# Patient Record
Sex: Female | Born: 1955 | Race: White | Hispanic: No | Marital: Married | Smoking: Never smoker
Health system: Southern US, Community
[De-identification: ages and names within clinical notes are randomized; demographics above are authoritative.]

## PROBLEM LIST (undated history)

## (undated) DIAGNOSIS — L570 Actinic keratosis: Secondary | ICD-10-CM

## (undated) DIAGNOSIS — K8000 Calculus of gallbladder with acute cholecystitis without obstruction: Secondary | ICD-10-CM

## (undated) DIAGNOSIS — J45909 Unspecified asthma, uncomplicated: Secondary | ICD-10-CM

## (undated) DIAGNOSIS — E785 Hyperlipidemia, unspecified: Secondary | ICD-10-CM

## (undated) DIAGNOSIS — Z8709 Personal history of other diseases of the respiratory system: Secondary | ICD-10-CM

## (undated) DIAGNOSIS — M5126 Other intervertebral disc displacement, lumbar region: Secondary | ICD-10-CM

## (undated) DIAGNOSIS — F419 Anxiety disorder, unspecified: Secondary | ICD-10-CM

## (undated) DIAGNOSIS — C801 Malignant (primary) neoplasm, unspecified: Secondary | ICD-10-CM

## (undated) HISTORY — PX: TONSILLECTOMY: SUR1361

## (undated) HISTORY — DX: Hyperlipidemia, unspecified: E78.5

## (undated) HISTORY — DX: Calculus of gallbladder with acute cholecystitis without obstruction: K80.00

## (undated) HISTORY — DX: Other intervertebral disc displacement, lumbar region: M51.26

## (undated) HISTORY — DX: Malignant (primary) neoplasm, unspecified: C80.1

## (undated) HISTORY — DX: Actinic keratosis: L57.0

## (undated) HISTORY — PX: OTHER SURGICAL HISTORY: SHX169

## (undated) HISTORY — DX: Anxiety disorder, unspecified: F41.9

## (undated) HISTORY — DX: Unspecified asthma, uncomplicated: J45.909

---

## 2008-09-27 ENCOUNTER — Other Ambulatory Visit: Admission: RE | Admit: 2008-09-27 | Discharge: 2008-09-27 | Payer: Self-pay | Admitting: Family Medicine

## 2008-10-04 ENCOUNTER — Encounter: Admission: RE | Admit: 2008-10-04 | Discharge: 2008-10-04 | Payer: Self-pay | Admitting: Family Medicine

## 2012-04-21 DIAGNOSIS — E039 Hypothyroidism, unspecified: Secondary | ICD-10-CM | POA: Insufficient documentation

## 2015-02-16 ENCOUNTER — Encounter: Payer: Self-pay | Admitting: Obstetrics & Gynecology

## 2015-02-16 ENCOUNTER — Ambulatory Visit (INDEPENDENT_AMBULATORY_CARE_PROVIDER_SITE_OTHER): Payer: Managed Care, Other (non HMO) | Admitting: Obstetrics & Gynecology

## 2015-02-16 VITALS — BP 126/72 | HR 105 | Resp 16 | Ht 67.5 in | Wt 155.0 lb

## 2015-02-16 DIAGNOSIS — Z1151 Encounter for screening for human papillomavirus (HPV): Secondary | ICD-10-CM

## 2015-02-16 DIAGNOSIS — Z01411 Encounter for gynecological examination (general) (routine) with abnormal findings: Secondary | ICD-10-CM

## 2015-02-16 DIAGNOSIS — Z124 Encounter for screening for malignant neoplasm of cervix: Secondary | ICD-10-CM

## 2015-02-16 DIAGNOSIS — Z0000 Encounter for general adult medical examination without abnormal findings: Secondary | ICD-10-CM

## 2015-02-16 DIAGNOSIS — L989 Disorder of the skin and subcutaneous tissue, unspecified: Secondary | ICD-10-CM

## 2015-02-16 DIAGNOSIS — N9089 Other specified noninflammatory disorders of vulva and perineum: Secondary | ICD-10-CM

## 2015-02-16 DIAGNOSIS — R1904 Left lower quadrant abdominal swelling, mass and lump: Secondary | ICD-10-CM

## 2015-02-16 DIAGNOSIS — R1900 Intra-abdominal and pelvic swelling, mass and lump, unspecified site: Secondary | ICD-10-CM

## 2015-02-16 NOTE — Progress Notes (Signed)
Subjective:    Taylor Dodson is a 59 y.o. MW G19 female who presents for an annual exam. The patient has no complaints today. The patient is sexually active. GYN screening history: last pap: was normal. The patient wears seatbelts: yes. The patient participates in regular exercise: yes. (goes to the gym)  Has the patient ever been transfused or tattooed?: no. The patient reports that there is not domestic violence in her life.   Menstrual History: OB History    Gravida Para Term Preterm AB TAB SAB Ectopic Multiple Living   0 0 0 0 0 0 0 0 0 0       Menarche age: 70  No LMP recorded. Patient is postmenopausal.    The following portions of the patient's history were reviewed and updated as appropriate: allergies, current medications, past family history, past medical history, past social history, past surgical history and problem list.  Review of Systems A comprehensive review of systems was negative. 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 She has had a flu vaccine. Her colonoscopy was done at 59 yo. Married for 25 years. Denies dyspareunia.   Objective:    BP 126/72 mmHg  Pulse 105  Resp 16  Ht 5' 7.5" (1.715 m)  Wt 155 lb (70.308 kg)  BMI 23.90 kg/m2  General Appearance:    Alert, cooperative, no distress, appears stated age  Head:    Normocephalic, without obvious abnormality, atraumatic  Eyes:    PERRL, conjunctiva/corneas clear, EOM's intact, fundi    benign, both eyes  Ears:    Normal TM's and external ear canals, both ears  Nose:   Nares normal, septum midline, mucosa normal, no drainage    or sinus tenderness  Throat:   Lips, mucosa, and tongue normal; teeth and gums normal  Neck:   Supple, symmetrical, trachea midline, no adenopathy;    thyroid:  no enlargement/tenderness/nodules; no carotid   bruit or JVD  Back:     Symmetric, no curvature, ROM normal, no CVA tenderness  Lungs:     Clear to auscultation bilaterally, respirations unlabored  Chest Wall:    No  tenderness or deformity   Heart:    Regular rate and rhythm, S1 and S2 normal, no murmur, rub   or gallop  Breast Exam:    No tenderness, masses, or nipple abnormality  Abdomen:     Soft, non-tender, bowel sounds active all four quadrants,    no masses, no organomegaly  Genitalia:    Normal female without lesion, discharge or tenderness, marked atrophy, right labia minora with a white lesion, NSSA, NT, mobile, left adnexal mass appreciated     Extremities:   Extremities normal, atraumatic, no cyanosis or edema  Pulses:   2+ and symmetric all extremities  Skin:   Skin color, texture, turgor normal, no rashes or lesions  Lymph nodes:   Cervical, supraclavicular, and axillary nodes normal  Neurologic:   CNII-XII intact, normal strength, sensation and reflexes    throughout  .    Assessment:    Healthy female exam.   New onset panic attacks - for the last year Right labial lesion Left pelvic mass   Plan:     Breast self exam technique reviewed and patient encouraged to perform self-exam monthly. Mammogram. Thin prep Pap smear.   Referral to Dr. Madilyn Fireman for panic attacks Schedule gyn u/s RTC for vulvar biopsy (after u/s)

## 2015-02-17 LAB — CYTOLOGY - PAP

## 2015-02-18 ENCOUNTER — Ambulatory Visit (INDEPENDENT_AMBULATORY_CARE_PROVIDER_SITE_OTHER): Payer: Managed Care, Other (non HMO) | Admitting: Family Medicine

## 2015-02-18 ENCOUNTER — Encounter: Payer: Self-pay | Admitting: Family Medicine

## 2015-02-18 DIAGNOSIS — E785 Hyperlipidemia, unspecified: Secondary | ICD-10-CM | POA: Insufficient documentation

## 2015-02-18 DIAGNOSIS — B001 Herpesviral vesicular dermatitis: Secondary | ICD-10-CM

## 2015-02-18 DIAGNOSIS — F410 Panic disorder [episodic paroxysmal anxiety] without agoraphobia: Secondary | ICD-10-CM

## 2015-02-18 MED ORDER — ACYCLOVIR 400 MG PO TABS: 400.0000 mg | ORAL_TABLET | Freq: Every day | ORAL | Status: DC

## 2015-02-18 NOTE — Progress Notes (Signed)
CC: Taylor Dodson is a 59 y.o. female is here for Establish Care   Subjective: HPI:  Pleasant 59 year old here to establish care  Patient complains of what she describes as panic attacks that have been present ever since July of this past summer. Symptoms can occur anytime of the day. They're reproducible if she is faced with the challenge of having to cross the bridge. She is fearful of bridges to the point where she will stop her car and get out if she is approaching the bridge. She denies any recent or remote event that seemed to cause this new sensation. She also notices at work she will have episodes where she loses her train of thought and then immediately starts panicking that something is wrong such as a heart attack however she does not have any chest pain or any other physical symptoms. She's also knows that she is much more nervous about nothing in particular on a daily basis. She's been awaking at night around 2 AM fully awake when she can usually sleep throughout the entire night without any difficulty. No interventions as of yet other than exercising more frequently that has not helped her worsen the above symptoms. Symptoms are mild to moderate in severity and have been persistent ever since onset in the summer.  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 It's been well controlled with acyclovir in the past. She was told she had to stop taking this when she established with her former PCP due to potential side effects. Since then she's had worsening outbreaks on the upper and lower lip without any benefit from lysine.  Review of Systems - General ROS: negative for - chills, fever, night sweats, weight gain or weight loss Ophthalmic ROS: negative for - decreased vision Psychological ROS: negative for -depression ENT ROS: negative for - hearing change, nasal congestion, tinnitus or allergies Hematological and  Lymphatic ROS: negative for - bleeding problems, bruising or swollen lymph nodes Breast ROS: negative Respiratory ROS: no cough, shortness of breath, or wheezing Cardiovascular ROS: no chest pain or dyspnea on exertion Gastrointestinal ROS: no abdominal pain, change in bowel habits, or black or bloody stools Genito-Urinary ROS: negative for - genital discharge, genital ulcers, incontinence or abnormal bleeding from genitals Musculoskeletal ROS: negative for - joint pain or muscle pain Neurological ROS: negative for - headaches or memory loss Dermatological ROS: negative for lumps, mole changes, rash and skin lesion changes other than that described above   Past Medical History  Diagnosis Date  . Asthma   . Hyperlipidemia     Past Surgical History  Procedure Laterality Date  . Tonsillectomy    . Neck tumor      benign   Family History  Problem Relation Age of Onset  . Cancer Mother     pancreatic  . Stroke Father   . Hypertension Father   . Mitral valve prolapse Father     History   Social History  . Marital Status: Married    Spouse Name: N/A  . Number of Children: N/A  . Years of Education: N/A   Occupational History  . progran analyst    Social History Main Topics  . Smoking status: Never Smoker   . Smokeless tobacco: Never Used  . Alcohol Use: 0.0 oz/week    0 Standard drinks or equivalent per week     Comment: 1 wine weekly  . Drug Use:  No  . Sexual Activity:    Partners: Male   Other Topics Concern  . Not on file   Social History Narrative     Objective: BP 136/73 mmHg  Pulse 94  Ht 5\' 7"  (1.702 m)  Wt 155 lb (70.308 kg)  BMI 24.27 kg/m2  Vital signs reviewed. General: Alert and Oriented, No Acute Distress HEENT: Pupils equal, round, reactive to light. Conjunctivae clear.  External ears unremarkable.  Moist mucous membranes. Lungs: Clear and comfortable work of breathing, speaking in full sentences without accessory muscle use. Cardiac: Regular  rate and rhythm.  Neuro: CN II-XII grossly intact, gait normal. Extremities: No peripheral edema.  Strong peripheral pulses.  Mental Status: No depression, anxiety, nor agitation. Logical though process. Skin: Warm and dry. no lesions on the lip   Assessment & Plan: Anselma was seen today for establish care.  Diagnoses and all orders for this visit:  Panic attacks Orders: -     TSH -     T3 -     T4 -     COMPLETE METABOLIC PANEL WITH GFR  Recurrent cold sores  Other orders -     acyclovir (ZOVIRAX) 400 MG tablet; Take 1 tablet (400 mg total) by mouth daily.   panic attacks: Ruling out hyperthyroidism or metabolic abnormality above.  She will begin thinking about taking a daily antianxiety medication while labs are pending, she is not interested in going to see a counselor or psychologist for cognitive behavior therapy because it might raise red flags with her employer.   recurrent cold sores: Prescription for acyclovir provided, I looked back at her labs in the [facility] system stemming back until 2011 and see no reason why she needed to stop this medication.  Return if symptoms worsen or fail to improve.

## 2015-02-19 LAB — COMPLETE METABOLIC PANEL WITH GFR
ALBUMIN: 4.7 g/dL (ref 3.5–5.2)
ALK PHOS: 83 U/L (ref 39–117)
ALT: 20 U/L (ref 0–35)
AST: 20 U/L (ref 0–37)
BUN: 14 mg/dL (ref 6–23)
CHLORIDE: 103 meq/L (ref 96–112)
CO2: 28 meq/L (ref 19–32)
Calcium: 9.9 mg/dL (ref 8.4–10.5)
Creat: 0.7 mg/dL (ref 0.50–1.10)
GFR, Est African American: >89 mL/min
GLUCOSE: 102 mg/dL — AB (ref 70–99)
Potassium: 4.7 mEq/L (ref 3.5–5.3)
Sodium: 143 mEq/L (ref 135–145)
Total Bilirubin: 0.3 mg/dL (ref 0.2–1.2)
Total Protein: 7.2 g/dL (ref 6.0–8.3)

## 2015-02-19 LAB — TSH: TSH: 1.708 u[IU]/mL (ref 0.350–4.500)

## 2015-02-19 LAB — T4: T4, Total: 7.8 ug/dL (ref 4.5–12.0)

## 2015-02-19 LAB — T3: T3, Total: 114 ng/dL (ref 80.0–204.0)

## 2015-02-23 ENCOUNTER — Other Ambulatory Visit: Payer: Managed Care, Other (non HMO)

## 2015-02-23 ENCOUNTER — Ambulatory Visit: Payer: Managed Care, Other (non HMO)

## 2015-02-25 ENCOUNTER — Telehealth: Payer: Self-pay | Admitting: *Deleted

## 2015-02-25 MED ORDER — ESCITALOPRAM OXALATE 10 MG PO TABS: 10.0000 mg | ORAL_TABLET | Freq: Every day | ORAL | Status: DC

## 2015-02-25 NOTE — Telephone Encounter (Signed)
Pt would like the the lexapro called into her pharm for her panic attacks( see result note)

## 2015-02-25 NOTE — Telephone Encounter (Signed)
Pt.notified

## 2015-02-25 NOTE — Telephone Encounter (Signed)
Taylor Dodson, Rx has been sent, will you please advise patient to take half a tab daily for the first five days then a full tablet daily thereafter.  F/u one month to assess response.

## 2015-03-02 ENCOUNTER — Ambulatory Visit (INDEPENDENT_AMBULATORY_CARE_PROVIDER_SITE_OTHER): Payer: Managed Care, Other (non HMO)

## 2015-03-02 DIAGNOSIS — Z0000 Encounter for general adult medical examination without abnormal findings: Secondary | ICD-10-CM

## 2015-03-02 DIAGNOSIS — R1900 Intra-abdominal and pelvic swelling, mass and lump, unspecified site: Secondary | ICD-10-CM

## 2015-03-02 DIAGNOSIS — Z1231 Encounter for screening mammogram for malignant neoplasm of breast: Secondary | ICD-10-CM

## 2015-03-15 ENCOUNTER — Telehealth: Payer: Self-pay | Admitting: *Deleted

## 2015-03-15 MED ORDER — VENLAFAXINE HCL ER 75 MG PO CP24: 75.0000 mg | ORAL_CAPSULE | Freq: Every day | ORAL | Status: DC

## 2015-03-15 NOTE — Telephone Encounter (Signed)
Pt called and states the lexapro rx' caused red blotching on her neck which then spread to her face and then she broke out in a rash. Pt stopped taking the medication. Lexapro has been added to allergy list

## 2015-03-15 NOTE — Telephone Encounter (Signed)
Pt said she would try it

## 2015-03-15 NOTE — Telephone Encounter (Signed)
If she's still interested in taking something to help with reducing or hopefully eliminating her panic attacks I'll send over a Rx of generic effexor [facility]. It works similarly to [employer] but molecularly is different in hopes that it doesn't provoke another allergic reaction.

## 2015-03-23 ENCOUNTER — Telehealth: Payer: Self-pay | Admitting: *Deleted

## 2015-03-23 NOTE — Telephone Encounter (Signed)
Taylor Dodson/coverage, Will you please let patient know that it has been charted that she never started the Effexor.  She may want to let her pharmacy know that the Rx I have on file for the Effexor was fur a whole capsule, her 1/2 instructions may have been an error with the pharmacy.

## 2015-03-28 NOTE — Telephone Encounter (Signed)
Message left on pt's vm

## 2015-03-29 ENCOUNTER — Telehealth: Payer: Self-pay | Admitting: *Deleted

## 2015-03-29 DIAGNOSIS — Z79899 Other long term (current) drug therapy: Secondary | ICD-10-CM

## 2015-03-29 DIAGNOSIS — Z5181 Encounter for therapeutic drug level monitoring: Secondary | ICD-10-CM

## 2015-03-29 DIAGNOSIS — E785 Hyperlipidemia, unspecified: Secondary | ICD-10-CM

## 2015-03-29 MED ORDER — PRAVASTATIN SODIUM 40 MG PO TABS: 40.0000 mg | ORAL_TABLET | Freq: Every day | ORAL | Status: DC

## 2015-03-29 NOTE — Telephone Encounter (Signed)
Seth Bake, [facility], needs bloodwork before future refills, lab slips in your inbox.  Fasting within the next 3 weeks.

## 2015-03-29 NOTE — Telephone Encounter (Signed)
Pt is requesting refill on pravachol

## 2015-03-30 NOTE — Telephone Encounter (Signed)
Pt states her last chol check was 12/15 and was 195 FYI

## 2015-03-31 NOTE — Telephone Encounter (Signed)
Spoke with patient..Taylor Dodson

## 2015-04-22 LAB — HEPATIC FUNCTION PANEL
ALBUMIN: 4.6 g/dL (ref 3.5–5.2)
ALT: 21 U/L (ref 0–35)
AST: 21 U/L (ref 0–37)
Alkaline Phosphatase: 96 U/L (ref 39–117)
BILIRUBIN DIRECT: 0.1 mg/dL (ref 0.0–0.3)
BILIRUBIN INDIRECT: 0.3 mg/dL (ref 0.2–1.2)
BILIRUBIN TOTAL: 0.4 mg/dL (ref 0.2–1.2)
Total Protein: 7.2 g/dL (ref 6.0–8.3)

## 2015-04-22 LAB — LIPID PANEL
CHOL/HDL RATIO: 2.9 ratio
CHOLESTEROL: 206 mg/dL — AB (ref 0–200)
HDL: 71 mg/dL (ref >=46)
LDL Cholesterol: 117 mg/dL — ABNORMAL HIGH (ref 0–99)
Triglycerides: 92 mg/dL (ref <150)
VLDL: 18 mg/dL (ref 0–40)

## 2015-04-25 ENCOUNTER — Other Ambulatory Visit: Payer: Self-pay | Admitting: Family Medicine

## 2015-04-25 ENCOUNTER — Telehealth: Payer: Self-pay | Admitting: Family Medicine

## 2015-04-25 MED ORDER — PRAVASTATIN SODIUM 40 MG PO TABS: 60.0000 mg | ORAL_TABLET | Freq: Every day | ORAL | Status: DC

## 2015-04-25 NOTE — Telephone Encounter (Signed)
Taylor Dodson, Will you please let patient know that her LDL cholesterol was mildly elevated and I'd recommend she increase her pravastatin dose to 60mg  daily.  I've sent a new Rx [facility]

## 2015-04-25 NOTE — Telephone Encounter (Signed)
Pt.notified

## 2015-09-25 ENCOUNTER — Emergency Department (INDEPENDENT_AMBULATORY_CARE_PROVIDER_SITE_OTHER)
Admission: EM | Admit: 2015-09-25 | Discharge: 2015-09-25 | Disposition: A | Discharge status: Home / Self Care | Payer: Managed Care, Other (non HMO) | Source: Home / Self Care | Attending: Family Medicine | Admitting: Family Medicine

## 2015-09-25 DIAGNOSIS — M5432 Sciatica, left side: Secondary | ICD-10-CM

## 2015-09-25 MED ORDER — TRAMADOL HCL 50 MG PO TABS: 50.0000 mg | ORAL_TABLET | Freq: Four times a day (QID) | ORAL | Status: DC | PRN

## 2015-09-25 MED ORDER — PREDNISONE 20 MG PO TABS: ORAL_TABLET | ORAL | Status: DC

## 2015-09-25 MED ORDER — IBUPROFEN 600 MG PO TABS: 600.0000 mg | ORAL_TABLET | Freq: Four times a day (QID) | ORAL | Status: DC | PRN

## 2015-09-25 NOTE — Discharge Instructions (Signed)
°  Tramadol is strong pain medication. While taking, do not drink alcohol, drive, or perform any other activities that requires focus while taking these medications.  ° °

## 2015-09-25 NOTE — ED Provider Notes (Signed)
CSN: [id number]     Arrival date & time 09/25/15  1238 History   First MD Initiated Contact with Patient 09/25/15 1245     Chief Complaint  Patient presents with  . Sciatica    Left   (Consider location/radiation/quality/duration/timing/severity/associated sxs/prior Treatment) HPI  Pt is a 59yo female presenting to [facility] with c/o sudden onset Left sided sciatic pain that woke pt from her sleep last night. Pain is moderate to severe, worse with sitting and laying in certain positions.  Pain is aching, burning and sharp, radiating down left leg into her toes.  Pt reports hx of similar pain about 1 year ago but on her Right side and pain at that time stopped at her knee. Pt was evaluated by a surgeon at that time as she had a herniated disc.  She never had surgery but was sent to physical therapy.  Symptoms resolved until started suddenly last night. Denies known heavy lifting or falls.  Denies fever, chills, or rashes. No other pain.  Past Medical History  Diagnosis Date  . Asthma   . Hyperlipidemia    Past Surgical History  Procedure Laterality Date  . Tonsillectomy    . Neck tumor      benign   Family History  Problem Relation Age of Onset  . Cancer Mother     pancreatic  . Stroke Father   . Hypertension Father   . Mitral valve prolapse Father    Social History  Substance Use Topics  . Smoking status: Never Smoker   . Smokeless tobacco: Never Used  . Alcohol Use: 0.0 oz/week    0 Standard drinks or equivalent per week     Comment: 1 wine weekly   OB History    Gravida Para Term Preterm AB TAB SAB Ectopic Multiple Living   0 0 0 0 0 0 0 0 0 0      Review of Systems  Constitutional: Negative for fever and chills.  Genitourinary: Negative for dysuria, frequency and flank pain.  Musculoskeletal: Positive for myalgias, back pain, arthralgias and gait problem. Negative for joint swelling.       Left buttock and Leg  Skin: Negative for color change and wound.  Neurological:  Positive for numbness. Negative for weakness.    Allergies  Augmentin and Lexapro  Home Medications   Prior to Admission medications   Medication Sig Start Date End Date Taking? Authorizing Provider  acyclovir (ZOVIRAX) 400 MG tablet Take 1 tablet (400 mg total) by mouth daily. 02/18/15  Yes Sean Hommel, DO  aspirin 81 MG tablet Take 81 mg by mouth.   Yes Historical Provider, MD  Ciclesonide 37 MCG/ACT AERS Place 1 puff into the nose. 10/24/11  Yes Historical Provider, MD  co-enzyme Q-10 30 MG capsule Take 30 mg by mouth.   Yes Historical Provider, MD  L-Lysine 1000 MG TABS Take 1 tablet by mouth.   Yes Historical Provider, MD  magnesium gluconate (MAGONATE) 500 MG tablet Take 500 mg by mouth.   Yes Historical Provider, MD  Multiple Vitamin (MULTIVITAMIN) tablet Take 1 tablet by mouth.   Yes Historical Provider, MD  pravastatin (PRAVACHOL) 40 MG tablet Take 1.5 tablets (60 mg total) by mouth daily. 04/25/15  Yes Sean Hommel, DO  ibuprofen (ADVIL,MOTRIN) 600 MG tablet Take 1 tablet (600 mg total) by mouth every 6 (six) hours as needed. 09/25/15   Noland Fordyce, PA-C  predniSONE (DELTASONE) 20 MG tablet 3 tabs po day one, then 2 po daily x  4 days 09/25/15   Noland Fordyce, PA-C  traMADol (ULTRAM) 50 MG tablet Take 1 tablet (50 mg total) by mouth every 6 (six) hours as needed. 09/25/15   Noland Fordyce, PA-C   Meds Ordered and Administered this Visit  Medications - No data to display  BP 153/86 mmHg  Pulse 93  Temp(Src) 97.8 F (36.6 C)  Wt 158 lb (71.668 kg)  SpO2 99% No data found.   Physical Exam  Constitutional: She is oriented to person, place, and time. She appears well-developed and well-nourished.  HENT:  Head: Normocephalic and atraumatic.  Eyes: EOM are normal.  Neck: Normal range of motion.  Cardiovascular: Normal rate.   Pulmonary/Chest: Effort normal.  Musculoskeletal: Normal range of motion. She exhibits tenderness. She exhibits no edema.  No midline spinal tenderness.  Full ROM upper and lower extremities bilaterally. Tenderness to Left buttock, Left posterior thigh and calf.   Calf is soft. Full ROM Left hip, knee and ankle. Increased pain with movement of Left leg and foot (plantarflexion and dorsiflexion) against resistance.  Neurological: She is alert and oriented to person, place, and time.  Reflex Scores:      Patellar reflexes are 2+ on the left side. Left leg: normal sensation. Antalgic gait.  Skin: Skin is warm and dry.  Psychiatric: She has a normal mood and affect. Her behavior is normal.  Nursing note and vitals reviewed.   ED Course  Procedures (including critical care time)  Labs Review Labs Reviewed - No data to display  Imaging Review No results found.    MDM   1. Left sided sciatica     Pt c/o Left sided sciatica w/o known cause. No recent injury. Exam and hx c/w Left sided sciatica. Doubt emergent process taking place at this time including cauda equina or spinal abscess. No imaging indicated at this time. Will tx conservatively. Rx: tramadol (pt hesitant on taking 'strong' pain medication as she has not taken it in the past), ibuprofen and prednisone. Home exercises in pt education packet provided. Encouraged to f/u with PCP in 1 week if not improving, may need referral to Sports Medicine or PT for further treatment. Pt declined work note. Patient verbalized understanding and agreement with treatment plan.     Noland Fordyce, PA-C 09/25/15 1324

## 2015-09-25 NOTE — ED Notes (Signed)
Patient complains of sciatic pain on the left side, states pain radiates down left leg , sx started last night

## 2015-11-01 ENCOUNTER — Other Ambulatory Visit: Payer: Self-pay | Admitting: Family Medicine

## 2016-01-27 ENCOUNTER — Other Ambulatory Visit: Payer: Self-pay | Admitting: Family Medicine

## 2016-02-04 ENCOUNTER — Emergency Department (INDEPENDENT_AMBULATORY_CARE_PROVIDER_SITE_OTHER)
Admission: EM | Admit: 2016-02-04 | Discharge: 2016-02-04 | Disposition: A | Discharge status: Home / Self Care | Payer: Managed Care, Other (non HMO) | Source: Home / Self Care | Attending: Family Medicine | Admitting: Family Medicine

## 2016-02-04 ENCOUNTER — Encounter: Payer: Self-pay | Admitting: Emergency Medicine

## 2016-02-04 DIAGNOSIS — J069 Acute upper respiratory infection, unspecified: Secondary | ICD-10-CM | POA: Diagnosis not present

## 2016-02-04 DIAGNOSIS — B9789 Other viral agents as the cause of diseases classified elsewhere: Principal | ICD-10-CM

## 2016-02-04 MED ORDER — AZITHROMYCIN 250 MG PO TABS: ORAL_TABLET | ORAL | Status: DC

## 2016-02-04 MED ORDER — PREDNISONE 20 MG PO TABS: 20.0000 mg | ORAL_TABLET | Freq: Two times a day (BID) | ORAL | Status: DC

## 2016-02-04 NOTE — ED Provider Notes (Signed)
CSN: [id number]     Arrival date & time 02/04/16  1339 History   First MD Initiated Contact with Patient 02/04/16 1556     Chief Complaint  Patient presents with  . Otalgia  . Nasal Congestion      HPI Comments: Patient complains of six day history of typical cold-like symptoms including mild sore throat, sinus congestion, headache, fatigue, and cough.  She has now developed a right earache and facial pain.  She had asthma as a child.  The history is provided by the patient.    Past Medical History  Diagnosis Date  . Asthma   . Hyperlipidemia    Past Surgical History  Procedure Laterality Date  . Tonsillectomy    . Neck tumor      benign   Family History  Problem Relation Age of Onset  . Cancer Mother     pancreatic  . Stroke Father   . Hypertension Father   . Mitral valve prolapse Father    Social History  Substance Use Topics  . Smoking status: Never Smoker   . Smokeless tobacco: Never Used  . Alcohol Use: 0.0 oz/week    0 Standard drinks or equivalent per week     Comment: 1 wine weekly   OB History    Gravida Para Term Preterm AB TAB SAB Ectopic Multiple Living   0 0 0 0 0 0 0 0 0 0      Review of Systems No sore throat + cough No pleuritic pain No wheezing + nasal congestion + post-nasal drainage + sinus pain/pressure No itchy/red eyes + right earache No hemoptysis No SOB No fever, + chills No nausea No vomiting No abdominal pain No diarrhea No urinary symptoms No skin rash + fatigue No myalgias + headache Used OTC meds without relief  Allergies  Augmentin and Lexapro  Home Medications   Prior to Admission medications   Medication Sig Start Date End Date Taking? Authorizing Provider  acyclovir (ZOVIRAX) 400 MG tablet Take 1 tablet (400 mg total) by mouth daily. NEED FOLLOW UP APPOINTMENT FOR MORE REFILLS 01/30/16   Marcial Pacas, DO  aspirin 81 MG tablet Take 81 mg by mouth.    Historical Provider, MD  azithromycin (ZITHROMAX Z-PAK) 250 MG  tablet Take 2 tabs today; then begin one tab once daily for 4 more days. 02/04/16   Kandra Nicolas, MD  Ciclesonide 37 MCG/ACT AERS Place 1 puff into the nose. 10/24/11   Historical Provider, MD  co-enzyme Q-10 30 MG capsule Take 30 mg by mouth.    Historical Provider, MD  ibuprofen (ADVIL,MOTRIN) 600 MG tablet Take 1 tablet (600 mg total) by mouth every 6 (six) hours as needed. 09/25/15   Noland Fordyce, PA-C  L-Lysine 1000 MG TABS Take 1 tablet by mouth.    Historical Provider, MD  magnesium gluconate (MAGONATE) 500 MG tablet Take 500 mg by mouth.    Historical Provider, MD  Multiple Vitamin (MULTIVITAMIN) tablet Take 1 tablet by mouth.    Historical Provider, MD  pravastatin (PRAVACHOL) 40 MG tablet TAKE 1.5 TABLETS (60 MG TOTAL) BY MOUTH DAILY. 11/01/15   Sean Hommel, DO  predniSONE (DELTASONE) 20 MG tablet Take 1 tablet (20 mg total) by mouth 2 (two) times daily. Take with food. 02/04/16   Kandra Nicolas, MD  traMADol (ULTRAM) 50 MG tablet Take 1 tablet (50 mg total) by mouth every 6 (six) hours as needed. 09/25/15   Noland Fordyce, PA-C   Meds Ordered and  Administered this Visit  Medications - No data to display  BP 137/83 mmHg  Pulse 98  Temp(Src) 97.9 F (36.6 C) (Oral)  Resp 16  Ht 5' 7.5" (1.715 m)  Wt 155 lb (70.308 kg)  BMI 23.90 kg/m2  SpO2 100% No data found.   Physical Exam Nursing notes and Vital Signs reviewed. Appearance:  Patient appears stated age, and in no acute distress Eyes:  Pupils are equal, round, and reactive to light and accomodation.  Extraocular movement is intact.  Conjunctivae are not inflamed  Ears:  Canals normal.  Tympanic membranes normal.  Nose:  Mildly congested turbinates.   Maxillary sinus tenderness is present.  Pharynx:  Normal Neck:  Supple.  Tender enlarged posterior nodes are palpated bilaterally  Lungs:  Clear to auscultation.  Breath sounds are equal.  Moving air well. Chest:  Distinct tenderness to palpation over the mid-sternum.  Heart:   Regular rate and rhythm without murmurs, rubs, or gallops.  Abdomen:  Nontender without masses or hepatosplenomegaly.  Bowel sounds are present.  No CVA or flank tenderness.  Extremities:  No edema.  Skin:  No rash present.   ED Course  Procedures none    Labs Reviewed -    Tympanogram:  Normal both ears    MDM   1. Viral URI with cough    Begin Z-pack, and prednisone burst. Take plain guaifenesin (1200mg  extended release tabs such as Mucinex) twice daily, with plenty of water, for cough and congestion.  May add Pseudoephedrine (30mg , one or two every 4 to 6 hours) for sinus congestion.  Get adequate rest.   May use Afrin nasal spray (or generic oxymetazoline) twice daily for about 5 days and then discontinue.  Also recommend using saline nasal spray several times daily and saline nasal irrigation (AYR is a common brand).  Use Flonase nasal spray each morning after using Afrin nasal spray and saline nasal irrigation. Try warm salt water gargles for sore throat.  Stop all antihistamines for now, and other non-prescription cough/cold preparations. May take Delsym Cough Suppressant at bedtime for nighttime cough.  Follow-up with family doctor if not improving about10 days.     Kandra Nicolas, MD 02/08/16 2020

## 2016-02-04 NOTE — Discharge Instructions (Signed)
Take plain guaifenesin (1200mg extended release tabs such as Mucinex) twice daily, with plenty of water, for cough and congestion.  May add Pseudoephedrine (30mg, one or two every 4 to 6 hours) for sinus congestion.  Get adequate rest.   °May use Afrin nasal spray (or generic oxymetazoline) twice daily for about 5 days and then discontinue.  Also recommend using saline nasal spray several times daily and saline nasal irrigation (AYR is a common brand).  Use Flonase nasal spray each morning after using Afrin nasal spray and saline nasal irrigation. °Try warm salt water gargles for sore throat.  °Stop all antihistamines for now, and other non-prescription cough/cold preparations. °May take Delsym Cough Suppressant at bedtime for nighttime cough.  °Follow-up with family doctor if not improving about10 days.  °

## 2016-02-04 NOTE — ED Notes (Signed)
Patient reports ear pain, headaches and congestion; feels as though she has sinus infection; tylenol at noon.

## 2016-02-10 ENCOUNTER — Telehealth: Payer: Self-pay

## 2016-02-20 ENCOUNTER — Encounter: Payer: Self-pay | Admitting: Family Medicine

## 2016-02-20 ENCOUNTER — Ambulatory Visit (INDEPENDENT_AMBULATORY_CARE_PROVIDER_SITE_OTHER): Payer: Managed Care, Other (non HMO)

## 2016-02-20 ENCOUNTER — Ambulatory Visit (INDEPENDENT_AMBULATORY_CARE_PROVIDER_SITE_OTHER): Payer: Managed Care, Other (non HMO) | Admitting: Family Medicine

## 2016-02-20 VITALS — BP 115/74 | HR 116 | Wt 150.0 lb

## 2016-02-20 DIAGNOSIS — R079 Chest pain, unspecified: Secondary | ICD-10-CM

## 2016-02-20 DIAGNOSIS — R112 Nausea with vomiting, unspecified: Secondary | ICD-10-CM

## 2016-02-20 LAB — CBC WITH DIFFERENTIAL/PLATELET
Basophils Absolute: 0.1 10*3/uL (ref 0.0–0.1)
Basophils Relative: 1 % (ref 0–1)
EOS ABS: 0.1 10*3/uL (ref 0.0–0.7)
EOS PCT: 1 % (ref 0–5)
HCT: 47.2 % — ABNORMAL HIGH (ref 36.0–46.0)
HEMOGLOBIN: 15.5 g/dL — AB (ref 12.0–15.0)
LYMPHS PCT: 20 % (ref 12–46)
Lymphs Abs: 2.9 10*3/uL (ref 0.7–4.0)
MCH: 31.6 pg (ref 26.0–34.0)
MCHC: 32.8 g/dL (ref 30.0–36.0)
MCV: 96.1 fL (ref 78.0–100.0)
MONO ABS: 1.8 10*3/uL — AB (ref 0.1–1.0)
MPV: 9.3 fL (ref 8.6–12.4)
Monocytes Relative: 12 % (ref 3–12)
Neutro Abs: 9.6 10*3/uL — ABNORMAL HIGH (ref 1.7–7.7)
Neutrophils Relative %: 66 % (ref 43–77)
Platelets: 489 10*3/uL — ABNORMAL HIGH (ref 150–400)
RBC: 4.91 MIL/uL (ref 3.87–5.11)
RDW: 13.3 % (ref 11.5–15.5)
WBC: 14.6 10*3/uL — AB (ref 4.0–10.5)

## 2016-02-20 MED ORDER — ACYCLOVIR 400 MG PO TABS: 400.0000 mg | ORAL_TABLET | Freq: Every day | ORAL | Status: DC

## 2016-02-20 MED ORDER — ONDANSETRON HCL 8 MG PO TABS: 8.0000 mg | ORAL_TABLET | Freq: Three times a day (TID) | ORAL | Status: DC | PRN

## 2016-02-20 NOTE — Addendum Note (Signed)
Addended by: Delrae Alfred on: 02/20/2016 03:10 PM   Modules accepted: Orders

## 2016-02-20 NOTE — Progress Notes (Signed)
CC: Taylor Dodson is a 60 y.o. female is here for Nausea and Emesis   Subjective: HPI:  Ever since Christmas she has been experiences episodes where for 1-3 days she'll have severe nausea with vomiting with it chest discomfort localized behind the sternum. Is described as a sharpness that radiates into her back. It'll go away on its own only to return in a few days to weeks. Nothing seems to make it better or worse other than it hurts to take deep breaths. She is only able tolerate fluids when she feels sick. The most current episode started on Friday and has persisted till today. She denies fevers, chills, cough, wheezing, or abdominal pain. There's been no diarrhea or Constipation. She denies blood in her vomit.denies flank pain or genitourinary complaints. She denies any exertional component to her chest discomfort   Review Of Systems Outlined In HPI  Past Medical History  Diagnosis Date  . Asthma   . Hyperlipidemia     Past Surgical History  Procedure Laterality Date  . Tonsillectomy    . Neck tumor      benign   Family History  Problem Relation Age of Onset  . Cancer Mother     pancreatic  . Stroke Father   . Hypertension Father   . Mitral valve prolapse Father     Social History   Social History  . Marital Status: Married    Spouse Name: N/A  . Number of Children: N/A  . Years of Education: N/A   Occupational History  . progran analyst    Social History Main Topics  . Smoking status: Never Smoker   . Smokeless tobacco: Never Used  . Alcohol Use: 0.0 oz/week    0 Standard drinks or equivalent per week     Comment: 1 wine weekly  . Drug Use: No  . Sexual Activity:    Partners: Male   Other Topics Concern  . Not on file   Social History Narrative     Objective: BP 115/74 mmHg  Pulse 116  Wt 150 lb (68.04 kg)  General: Alert and Oriented, No Acute Distress HEENT: Pupils equal, round, reactive to light. Conjunctivae clear. Moist mucous membranes Lungs:  Clear to auscultation bilaterally, no wheezing/ronchi/rales.  Comfortable work of breathing. Good air movement. Cardiac: regular rhythm  With mild tachycardia. No murmurs or rubs. Abdomen: Normal bowel sounds, soft and non tender without palpable masses. No guarding or rebound tenderness Extremities: No peripheral edema.  Strong peripheral pulses.  Mental Status: No depression, anxiety, nor agitation. Skin: Warm and dry.  Assessment & Plan: Taylor Dodson was seen today for nausea and emesis.  Diagnoses and all orders for this visit:  Chest pain, unspecified chest pain type -     COMPLETE METABOLIC PANEL WITH GFR -     CBC with Differential -     Lipase -     Gamma GT -     DG Chest 2 View; Future -     ondansetron (ZOFRAN) 8 MG tablet; Take 1 tablet (8 mg total) by mouth every 8 (eight) hours as needed for nausea or vomiting.  Non-intractable vomiting with nausea, vomiting of unspecified type -     COMPLETE METABOLIC PANEL WITH GFR -     CBC with Differential -     Lipase -     Gamma GT -     DG Chest 2 View; Future -     ondansetron (ZOFRAN) 8 MG tablet; Take 1 tablet (8  mg total) by mouth every 8 (eight) hours as needed for nausea or vomiting.  Other orders -     acyclovir (ZOVIRAX) 400 MG tablet; Take 1 tablet (400 mg total) by mouth daily.    EKG was obtained showing normal sinus rhythm, normal axis, no pathologic Q waves, and no ST segment elevation or depression. Obtaining chest x-ray with labs above for further evaluation of what's causing her discomfort. Keep in mind with her parents died from pancreatic cancer.  Return if symptoms worsen or fail to improve.

## 2016-02-21 ENCOUNTER — Telehealth: Payer: Self-pay | Admitting: Family Medicine

## 2016-02-21 DIAGNOSIS — R1013 Epigastric pain: Secondary | ICD-10-CM

## 2016-02-21 LAB — COMPLETE METABOLIC PANEL WITH GFR
ALBUMIN: 4.7 g/dL (ref 3.6–5.1)
ALT: 19 U/L (ref 6–29)
AST: 20 U/L (ref 10–35)
Alkaline Phosphatase: 89 U/L (ref 33–130)
BILIRUBIN TOTAL: 0.9 mg/dL (ref 0.2–1.2)
BUN: 12 mg/dL (ref 7–25)
CALCIUM: 10 mg/dL (ref 8.6–10.4)
CHLORIDE: 98 mmol/L (ref 98–110)
CO2: 26 mmol/L (ref 20–31)
CREATININE: 0.76 mg/dL (ref 0.50–1.05)
GFR, Est Non African American: 86 mL/min (ref >=60)
Glucose, Bld: 106 mg/dL — ABNORMAL HIGH (ref 65–99)
Potassium: 4.2 mmol/L (ref 3.5–5.3)
Sodium: 138 mmol/L (ref 135–146)
TOTAL PROTEIN: 7.5 g/dL (ref 6.1–8.1)

## 2016-02-21 LAB — GAMMA GT: GGT: 18 U/L (ref 7–51)

## 2016-02-21 LAB — LIPASE: LIPASE: 25 U/L (ref 7–60)

## 2016-02-21 NOTE — Telephone Encounter (Signed)
Awaiting call back.

## 2016-02-21 NOTE — Telephone Encounter (Signed)
Will you please let patient know that her pancreatic test, liver test, and gallbladder labs were all normal.  Her white blood cell count was elevated which would suggest that her discomfort and nausea is from an infection.  If this does not resolve by Wednesday or worsens I'd recommend having an ultrasound performed on the abdomen.  An order for this has been placed today, can you please give her the phone number to radiology in case she needs to schedule this.

## 2016-02-22 NOTE — Telephone Encounter (Signed)
Pt.notified

## 2016-02-23 ENCOUNTER — Telehealth: Payer: Self-pay | Admitting: Family Medicine

## 2016-02-23 ENCOUNTER — Ambulatory Visit (INDEPENDENT_AMBULATORY_CARE_PROVIDER_SITE_OTHER): Payer: Managed Care, Other (non HMO)

## 2016-02-23 DIAGNOSIS — K8080 Other cholelithiasis without obstruction: Secondary | ICD-10-CM | POA: Diagnosis not present

## 2016-02-23 DIAGNOSIS — K8000 Calculus of gallbladder with acute cholecystitis without obstruction: Secondary | ICD-10-CM

## 2016-02-23 NOTE — Telephone Encounter (Signed)
fwill you please let patient know that her ultrasound shows that she has multiple gallstones in the gallbladder with a thickened wall. This can sometimes represent  Gallbladder inflammation which could be the cause of her pain and vomiting. I recommended that she visit with the general surgeon to discuss the possibility of removing the gallbladder. A referral has been placed please contact me if not notified by tomorrow.

## 2016-02-24 ENCOUNTER — Other Ambulatory Visit: Payer: Managed Care, Other (non HMO)

## 2016-03-02 ENCOUNTER — Other Ambulatory Visit: Payer: Self-pay | Admitting: Surgery

## 2016-03-02 ENCOUNTER — Encounter: Payer: Self-pay | Admitting: Family Medicine

## 2016-03-02 DIAGNOSIS — K8000 Calculus of gallbladder with acute cholecystitis without obstruction: Secondary | ICD-10-CM

## 2016-03-02 HISTORY — DX: Calculus of gallbladder with acute cholecystitis without obstruction: K80.00

## 2016-03-07 ENCOUNTER — Encounter (HOSPITAL_COMMUNITY): Payer: Self-pay

## 2016-03-07 ENCOUNTER — Encounter (HOSPITAL_COMMUNITY)
Admission: RE | Admit: 2016-03-07 | Discharge: 2016-03-07 | Disposition: A | Discharge status: Home / Self Care | Payer: Managed Care, Other (non HMO) | Source: Ambulatory Visit | Attending: Surgery | Admitting: Surgery

## 2016-03-07 DIAGNOSIS — Z800 Family history of malignant neoplasm of digestive organs: Secondary | ICD-10-CM | POA: Diagnosis not present

## 2016-03-07 DIAGNOSIS — Z79899 Other long term (current) drug therapy: Secondary | ICD-10-CM | POA: Diagnosis not present

## 2016-03-07 DIAGNOSIS — E7800 Pure hypercholesterolemia, unspecified: Secondary | ICD-10-CM | POA: Diagnosis not present

## 2016-03-07 DIAGNOSIS — K8010 Calculus of gallbladder with chronic cholecystitis without obstruction: Secondary | ICD-10-CM | POA: Diagnosis not present

## 2016-03-07 DIAGNOSIS — Z7982 Long term (current) use of aspirin: Secondary | ICD-10-CM | POA: Diagnosis not present

## 2016-03-07 HISTORY — DX: Personal history of other diseases of the respiratory system: Z87.09

## 2016-03-07 LAB — CBC
HEMATOCRIT: 44.7 % (ref 36.0–46.0)
Hemoglobin: 14.5 g/dL (ref 12.0–15.0)
MCH: 32.6 pg (ref 26.0–34.0)
MCHC: 32.4 g/dL (ref 30.0–36.0)
MCV: 100.4 fL — AB (ref 78.0–100.0)
Platelets: 579 10*3/uL — ABNORMAL HIGH (ref 150–400)
RBC: 4.45 MIL/uL (ref 3.87–5.11)
RDW: 12.2 % (ref 11.5–15.5)
WBC: 8.8 10*3/uL (ref 4.0–10.5)

## 2016-03-07 NOTE — Patient Instructions (Signed)
Taylor Dodson  03/07/2016   Your procedure is scheduled on: Friday March 09, 2016  Report to [facility] Main  Entrance take [place]  elevators to 3rd floor to  [place] at 5:30 AM.  Call this number if you have problems the morning of surgery [phone]   Remember: ONLY 1 PERSON MAY GO WITH YOU TO SHORT STAY TO GET  READY MORNING OF [place].  Do not eat food or drink liquids :After Midnight.     Take these medicines the morning of surgery with A SIP OF WATER: NONE                               You may not have any metal on your body including hair pins and              piercings  Do not wear jewelry,makeup, lotions, powders or perfumes, deodorant             Do not wear nail polish.  Do not shave  48 hours prior to surgery.               Do not bring valuables to the hospital. [place].  Contacts, dentures or bridgework may not be worn into surgery.      Patients discharged the day of surgery will not be allowed to drive home.  Name and phone number of your driver:[name] (sister)             _____________________________________________________________________             [facility] - Preparing for Surgery Before surgery, you can play an important role.  Because skin is not sterile, your skin needs to be as free of germs as possible.  You can reduce the number of germs on your skin by washing with CHG (chlorahexidine gluconate) soap before surgery.  CHG is an antiseptic cleaner which kills germs and bonds with the skin to continue killing germs even after washing. Please DO NOT use if you have an allergy to CHG or antibacterial soaps.  If your skin becomes reddened/irritated stop using the CHG and inform your nurse when you arrive at Short Stay. Do not shave (including legs and underarms) for at least 48 hours prior to the first CHG shower.  You may shave your face/neck. Please follow these  instructions carefully:  1.  Shower with CHG Soap the night before surgery and the  morning of Surgery.  2.  If you choose to wash your hair, wash your hair first as usual with your  normal  shampoo.  3.  After you shampoo, rinse your hair and body thoroughly to remove the  shampoo.                           4.  Use CHG as you would any other liquid soap.  You can apply chg directly  to the skin and wash                       Gently with a scrungie or clean washcloth.  5.  Apply the CHG Soap to your body ONLY FROM THE NECK DOWN.  Do not use on face/ open                           Wound or open sores. Avoid contact with eyes, ears mouth and genitals (private parts).                       Wash face,  Genitals (private parts) with your normal soap.             6.  Wash thoroughly, paying special attention to the area where your surgery  will be performed.  7.  Thoroughly rinse your body with warm water from the neck down.  8.  DO NOT shower/wash with your normal soap after using and rinsing off  the CHG Soap.                9.  Pat yourself dry with a clean towel.            10.  Wear clean pajamas.            11.  Place clean sheets on your bed the night of your first shower and do not  sleep with pets. Day of Surgery : Do not apply any lotions/deodorants the morning of surgery.  Please wear clean clothes to the hospital/surgery center.  FAILURE TO FOLLOW THESE INSTRUCTIONS MAY RESULT IN THE CANCELLATION OF YOUR SURGERY PATIENT SIGNATURE_________________________________  NURSE SIGNATURE__________________________________  ________________________________________________________________________

## 2016-03-07 NOTE — Progress Notes (Signed)
CBC results in epic per PAT visit 03/07/2016 sent to Dr Evlyn Courier Pt aware to discontinue 81 mg aspirin till after procedure; pt was unaware to discontinue 5 days prior per surgeon order; last dose 03/06/2016

## 2016-03-07 NOTE — Progress Notes (Signed)
CXR epic 02/20/2016 EKG epic 02/22/2016

## 2016-03-08 NOTE — H&P (Signed)
Taylor Dodson 03/02/2016 11:09 AM Location: [facility] Patient #: [id number] DOB: 10/07/1956 Married / Language: English / Race: White Female   History of Present Illness (Taylor Dodson [name] MD; 03/02/2016 11:25 AM) The patient is a 60 year old female who presents for evaluation of gall stones. She is referred by Dr. Marcial Pacas for abdominal pain, nausea and vomiting. This is been occurring consistently since Christmas. She had been having less attacks before then. She describes a severe sharp pain in the epigastrium pain moving to right side with nausea and vomiting the last 3 days after fatty meals. She has improved greatly after low-fat diet. She is currently pain-free today. She has a family history significant for her mother having died of gallbladder cancer. Bowel movement within normal. She is otherwise healthy without complaints.   Other Problems Taylor Dodson, CMA; 03/02/2016 11:09 AM) Asthma Back Pain Hypercholesterolemia Migraine Headache  Past Surgical History Taylor Dodson, CMA; 03/02/2016 11:09 AM) Tonsillectomy  Diagnostic Studies History Taylor Dodson, CMA; 03/02/2016 11:09 AM) Colonoscopy 1-5 years ago Mammogram within last year Pap Smear 1-5 years ago  Allergies Taylor Dodson, CMA; 03/02/2016 11:10 AM) Augmentin *PENICILLINS* Lexapro *ANTIDEPRESSANTS*  Medication History (Taylor Dodson, CMA; 03/02/2016 11:12 AM) Pravastatin Sodium (40MG  Tablet, Oral) Active. Aspirin (81MG  Tablet Chewable, Oral) Active. Zovirax (400MG  Tablet, Oral) Active. Coenzyme Q10 (10MG  Capsule, Oral) Active. Advil (200MG  Capsule, Oral) Active. Lysine (1000MG  Tablet, Oral) Active. Multivitamin Adult (Oral) Active. Medications Reconciled  Social History Taylor Dodson, CMA; 03/02/2016 11:09 AM) Alcohol use Occasional alcohol use. Caffeine use Carbonated beverages, Coffee, Tea. No drug use Tobacco use Never smoker.  Family History Taylor Dodson, [place]; 03/02/2016 11:09  AM) Arthritis Mother. Cerebrovascular Accident Father. Hypertension Father. Kidney Disease Mother. Malignant Neoplasm Of Pancreas Father. Melanoma Brother, Father. Migraine Headache Mother. Thyroid problems Mother, Sister.  Pregnancy / Birth History Taylor Dodson, [place]; 03/02/2016 11:09 AM) Age at menarche 50 years. Age of menopause 78-55 Gravida 0 Irregular periods Para 0    Review of Systems ([place]; 03/02/2016 11:09 AM) General Present- Appetite Loss, Chills, Fatigue, Night Sweats and Weight Loss. Not Present- Fever and Weight Gain. Skin Not Present- Change in Wart/Mole, Dryness, Hives, Jaundice, New Lesions, Non-Healing Wounds, Rash and Ulcer. HEENT Not Present- Earache, Hearing Loss, Hoarseness, Nose Bleed, Oral Ulcers, Ringing in the Ears, Seasonal Allergies, Sinus Pain, Sore Throat, Visual Disturbances, Wears glasses/contact lenses and Yellow Eyes. Respiratory Not Present- Bloody sputum, Chronic Cough, Difficulty Breathing, Snoring and Wheezing. Breast Not Present- Breast Mass, Breast Pain, Nipple Discharge and Skin Changes. Cardiovascular Not Present- Chest Pain, Difficulty Breathing Lying Down, Leg Cramps, Palpitations, Rapid Heart Rate, Shortness of Breath and Swelling of Extremities. Gastrointestinal Present- Abdominal Pain, Nausea and Vomiting. Not Present- Bloating, Bloody Stool, Change in Bowel Habits, Chronic diarrhea, Constipation, Difficulty Swallowing, Excessive gas, Gets full quickly at meals, Hemorrhoids, Indigestion and Rectal Pain. Female Genitourinary Not Present- Frequency, Nocturia, Painful Urination, Pelvic Pain and Urgency. Musculoskeletal Not Present- Back Pain, Joint Pain, Joint Stiffness, Muscle Pain, Muscle Weakness and Swelling of Extremities. Neurological Not Present- Decreased Memory, Fainting, Headaches, Numbness, Seizures, Tingling, Tremor, Trouble walking and Weakness. Psychiatric Not Present- Anxiety, Bipolar, Change in Sleep  Pattern, Depression, Fearful and Frequent crying. Endocrine Not Present- Cold Intolerance, Excessive Hunger, Hair Changes, Heat Intolerance, Hot flashes and New Diabetes. Hematology Not Present- Easy Bruising, Excessive bleeding, Gland problems, HIV and Persistent Infections.  Vitals (Taylor Dodson CMA; 03/02/2016 11:10 AM) 03/02/2016 11:09 AM Weight: 152 lb Height: 67in Body Surface Area: 1.8 m Body  Mass Index: 23.81 kg/m  Temp.: 55F(Temporal)  Pulse: 75 (Regular)  BP: 132/74 (Sitting, Left Arm, Standard)       Physical Exam (Taylor Dodson [name] MD; 03/02/2016 11:25 AM) General Mental Status-Alert. General Appearance-Consistent with stated age. Hydration-Well hydrated. Voice-Normal.  Head and Neck Head-normocephalic, atraumatic with no lesions or palpable masses. Trachea-midline. Thyroid Gland Characteristics - normal size and consistency.  Eye Eyeball - Bilateral-Extraocular movements intact. Sclera/Conjunctiva - Bilateral-No scleral icterus.  Chest and Lung Exam Chest and lung exam reveals -quiet, even and easy respiratory effort with no use of accessory muscles and on auscultation, normal breath sounds, no adventitious sounds and normal vocal resonance. Inspection Chest Wall - Normal. Back - normal.  Breast - Did not examine.  Cardiovascular Cardiovascular examination reveals -normal heart sounds, regular rate and rhythm with no murmurs and normal pedal pulses bilaterally.  Abdomen Inspection Inspection of the abdomen reveals - No Hernias. Skin - Scar - no surgical scars. Palpation/Percussion Palpation and Percussion of the abdomen reveal - Soft, Non Tender, No Rebound tenderness, No Rigidity (guarding) and No hepatosplenomegaly. Auscultation Auscultation of the abdomen reveals - Bowel sounds normal.  Neurologic Neurologic evaluation reveals -alert and oriented x 3 with no impairment of recent or remote memory. Mental  Status-Normal.  Musculoskeletal Normal Exam - Left-Upper Extremity Strength Normal and Lower Extremity Strength Normal. Normal Exam - Right-Upper Extremity Strength Normal and Lower Extremity Strength Normal.  Lymphatic - Did not examine.    Assessment & Plan (Brunilda Eble [name] MD; 03/02/2016 11:26 AM) CHRONIC CHOLECYSTITIS WITH CALCULUS (K80.10) Impression: Laparoscopic cholecystectomy is strongly recommended. Her ultrasound does show a thickened gallbladder wall with gallstones. The bile ducts are normal. Her liver function tests were also normal. I gave her literature regarding surgery. I discussed the surgical procedure in detail. I discussed the risk of surgery which includes but is not limited to bleeding, infection, bile duct injury, bile leak, injury to surrounding structures, the need to convert to an open procedure, postoperative recovery, etc. She understands and wishes to proceed. Surgery will be scheduled urgently. Current Plans Started Zofran ODT 4MG , 1 (one) Tablet every six hours, as needed, #20, 03/02/2016, Ref. x1.   Signed by Harl Bowie, MD (03/02/2016

## 2016-03-09 ENCOUNTER — Ambulatory Visit (HOSPITAL_COMMUNITY): Payer: Managed Care, Other (non HMO) | Admitting: Registered Nurse

## 2016-03-09 ENCOUNTER — Encounter (HOSPITAL_COMMUNITY)
Admission: RE | Disposition: A | Discharge status: Home / Self Care | Payer: Self-pay | Source: Ambulatory Visit | Attending: Surgery

## 2016-03-09 ENCOUNTER — Encounter (HOSPITAL_COMMUNITY): Payer: Self-pay | Admitting: *Deleted

## 2016-03-09 ENCOUNTER — Ambulatory Visit (HOSPITAL_COMMUNITY)
Admission: RE | Admit: 2016-03-09 | Discharge: 2016-03-09 | Disposition: A | Discharge status: Home / Self Care | Payer: Managed Care, Other (non HMO) | Source: Ambulatory Visit | Attending: Surgery | Admitting: Surgery

## 2016-03-09 DIAGNOSIS — K8010 Calculus of gallbladder with chronic cholecystitis without obstruction: Secondary | ICD-10-CM | POA: Insufficient documentation

## 2016-03-09 DIAGNOSIS — Z7982 Long term (current) use of aspirin: Secondary | ICD-10-CM | POA: Insufficient documentation

## 2016-03-09 DIAGNOSIS — E7800 Pure hypercholesterolemia, unspecified: Secondary | ICD-10-CM | POA: Insufficient documentation

## 2016-03-09 DIAGNOSIS — Z79899 Other long term (current) drug therapy: Secondary | ICD-10-CM | POA: Insufficient documentation

## 2016-03-09 DIAGNOSIS — Z800 Family history of malignant neoplasm of digestive organs: Secondary | ICD-10-CM | POA: Insufficient documentation

## 2016-03-09 HISTORY — PX: CHOLECYSTECTOMY: SHX55

## 2016-03-09 SURGERY — LAPAROSCOPIC CHOLECYSTECTOMY
Anesthesia: General

## 2016-03-09 MED ORDER — LACTATED RINGERS IV SOLN
INTRAVENOUS | Status: DC | PRN
  Administered 2016-03-09: 07:00:00 via INTRAVENOUS

## 2016-03-09 MED ORDER — PROPOFOL 10 MG/ML IV BOLUS
INTRAVENOUS | Status: DC | PRN
  Administered 2016-03-09: 140 mg via INTRAVENOUS
  Administered 2016-03-09: 30 mg via INTRAVENOUS

## 2016-03-09 MED ORDER — SODIUM CHLORIDE 0.9% FLUSH: 3.0000 mL | INTRAVENOUS | Status: DC | PRN

## 2016-03-09 MED ORDER — LIDOCAINE HCL (CARDIAC) 20 MG/ML IV SOLN
INTRAVENOUS | Status: AC
  Filled 2016-03-09: qty 5

## 2016-03-09 MED ORDER — SODIUM CHLORIDE 0.9% FLUSH: 3.0000 mL | Freq: Two times a day (BID) | INTRAVENOUS | Status: DC

## 2016-03-09 MED ORDER — PROMETHAZINE HCL 25 MG/ML IJ SOLN
INTRAMUSCULAR | Status: AC
  Filled 2016-03-09: qty 1

## 2016-03-09 MED ORDER — FENTANYL CITRATE (PF) 250 MCG/5ML IJ SOLN
INTRAMUSCULAR | Status: AC
  Filled 2016-03-09: qty 5

## 2016-03-09 MED ORDER — MIDAZOLAM HCL 5 MG/5ML IJ SOLN
INTRAMUSCULAR | Status: DC | PRN
  Administered 2016-03-09: 2 mg via INTRAVENOUS

## 2016-03-09 MED ORDER — FENTANYL CITRATE (PF) 100 MCG/2ML IJ SOLN
INTRAMUSCULAR | Status: DC | PRN
  Administered 2016-03-09: 50 ug via INTRAVENOUS

## 2016-03-09 MED ORDER — ROCURONIUM BROMIDE 100 MG/10ML IV SOLN
INTRAVENOUS | Status: AC
  Filled 2016-03-09: qty 1

## 2016-03-09 MED ORDER — SUCCINYLCHOLINE CHLORIDE 20 MG/ML IJ SOLN
INTRAMUSCULAR | Status: DC | PRN
  Administered 2016-03-09: 100 mg via INTRAVENOUS

## 2016-03-09 MED ORDER — ROCURONIUM BROMIDE 100 MG/10ML IV SOLN
INTRAVENOUS | Status: DC | PRN
  Administered 2016-03-09: 20 mg via INTRAVENOUS

## 2016-03-09 MED ORDER — ACETAMINOPHEN 650 MG RE SUPP
650.0000 mg | RECTAL | Status: DC | PRN
  Filled 2016-03-09: qty 1

## 2016-03-09 MED ORDER — ONDANSETRON HCL 4 MG/2ML IJ SOLN
4.0000 mg | Freq: Once | INTRAMUSCULAR | Status: AC | PRN
  Administered 2016-03-09: 4 mg via INTRAVENOUS

## 2016-03-09 MED ORDER — PROPOFOL 10 MG/ML IV BOLUS
INTRAVENOUS | Status: AC
  Filled 2016-03-09: qty 20

## 2016-03-09 MED ORDER — BUPIVACAINE HCL (PF) 0.5 % IJ SOLN
INTRAMUSCULAR | Status: AC
  Filled 2016-03-09: qty 30

## 2016-03-09 MED ORDER — SODIUM CHLORIDE 0.9 % IV SOLN: 250.0000 mL | INTRAVENOUS | Status: DC | PRN

## 2016-03-09 MED ORDER — PHENYLEPHRINE HCL 10 MG/ML IJ SOLN
INTRAMUSCULAR | Status: DC | PRN
  Administered 2016-03-09: 80 ug via INTRAVENOUS

## 2016-03-09 MED ORDER — KETOROLAC TROMETHAMINE 30 MG/ML IJ SOLN
INTRAMUSCULAR | Status: AC
  Filled 2016-03-09: qty 1

## 2016-03-09 MED ORDER — BUPIVACAINE HCL (PF) 0.5 % IJ SOLN
INTRAMUSCULAR | Status: DC | PRN
Start: 2016-03-09 — End: 2016-03-09
  Administered 2016-03-09: 20 mL

## 2016-03-09 MED ORDER — PHENYLEPHRINE 40 MCG/ML (10ML) SYRINGE FOR IV PUSH (FOR BLOOD PRESSURE SUPPORT)
PREFILLED_SYRINGE | INTRAVENOUS | Status: AC
  Filled 2016-03-09: qty 10

## 2016-03-09 MED ORDER — OXYCODONE HCL 5 MG PO TABS: 5.0000 mg | ORAL_TABLET | ORAL | Status: DC | PRN

## 2016-03-09 MED ORDER — HYDROMORPHONE HCL 1 MG/ML IJ SOLN
INTRAMUSCULAR | Status: AC
  Filled 2016-03-09: qty 1

## 2016-03-09 MED ORDER — CIPROFLOXACIN IN D5W 400 MG/200ML IV SOLN: 400.0000 mg | INTRAVENOUS | Status: DC

## 2016-03-09 MED ORDER — PROMETHAZINE HCL 25 MG/ML IJ SOLN
6.2500 mg | INTRAMUSCULAR | Status: AC | PRN
  Administered 2016-03-09 (×2): 6.25 mg via INTRAVENOUS

## 2016-03-09 MED ORDER — LACTATED RINGERS IV SOLN: INTRAVENOUS | Status: DC

## 2016-03-09 MED ORDER — ACETAMINOPHEN 325 MG PO TABS: 650.0000 mg | ORAL_TABLET | ORAL | Status: DC | PRN

## 2016-03-09 MED ORDER — HYDROMORPHONE HCL 1 MG/ML IJ SOLN
0.2500 mg | INTRAMUSCULAR | Status: DC | PRN
  Administered 2016-03-09 (×2): 0.5 mg via INTRAVENOUS

## 2016-03-09 MED ORDER — CIPROFLOXACIN IN D5W 400 MG/200ML IV SOLN
INTRAVENOUS | Status: DC | PRN
  Administered 2016-03-09: 400 mg via INTRAVENOUS

## 2016-03-09 MED ORDER — ONDANSETRON HCL 4 MG/2ML IJ SOLN
INTRAMUSCULAR | Status: AC
  Filled 2016-03-09: qty 2

## 2016-03-09 MED ORDER — LIDOCAINE HCL (CARDIAC) 20 MG/ML IV SOLN
INTRAVENOUS | Status: DC | PRN
  Administered 2016-03-09: 80 mg via INTRAVENOUS

## 2016-03-09 MED ORDER — CIPROFLOXACIN IN D5W 400 MG/200ML IV SOLN
INTRAVENOUS | Status: AC
  Filled 2016-03-09: qty 200

## 2016-03-09 MED ORDER — MORPHINE SULFATE (PF) 10 MG/ML IV SOLN: 1.0000 mg | INTRAVENOUS | Status: DC | PRN

## 2016-03-09 MED ORDER — SUGAMMADEX SODIUM 200 MG/2ML IV SOLN
INTRAVENOUS | Status: AC
  Filled 2016-03-09: qty 2

## 2016-03-09 MED ORDER — SUGAMMADEX SODIUM 200 MG/2ML IV SOLN
INTRAVENOUS | Status: DC | PRN
  Administered 2016-03-09: 150 mg via INTRAVENOUS

## 2016-03-09 MED ORDER — DEXAMETHASONE SODIUM PHOSPHATE 10 MG/ML IJ SOLN
INTRAMUSCULAR | Status: AC
  Filled 2016-03-09: qty 1

## 2016-03-09 MED ORDER — TRAMADOL HCL 50 MG PO TABS: 50.0000 mg | ORAL_TABLET | Freq: Four times a day (QID) | ORAL | Status: DC | PRN

## 2016-03-09 MED ORDER — MIDAZOLAM HCL 2 MG/2ML IJ SOLN
INTRAMUSCULAR | Status: AC
  Filled 2016-03-09: qty 2

## 2016-03-09 MED ORDER — DEXAMETHASONE SODIUM PHOSPHATE 10 MG/ML IJ SOLN
INTRAMUSCULAR | Status: DC | PRN
  Administered 2016-03-09: 10 mg via INTRAVENOUS

## 2016-03-09 SURGICAL SUPPLY — 31 items
APPLIER CLIP 5 13 M/L LIGAMAX5 (MISCELLANEOUS) ×3
BANDAGE ADH SHEER 1  50/CT (GAUZE/BANDAGES/DRESSINGS) IMPLANT
BENZOIN TINCTURE PRP APPL 2/3 (GAUZE/BANDAGES/DRESSINGS) ×3 IMPLANT
CHLORAPREP W/TINT 26ML (MISCELLANEOUS) ×3 IMPLANT
CLIP APPLIE 5 13 M/L LIGAMAX5 (MISCELLANEOUS) ×1 IMPLANT
CLOSURE WOUND 1/2 X4 (GAUZE/BANDAGES/DRESSINGS)
COVER MAYO STAND STRL (DRAPES) IMPLANT
COVER SURGICAL LIGHT HANDLE (MISCELLANEOUS) ×3 IMPLANT
DECANTER SPIKE VIAL GLASS SM (MISCELLANEOUS) IMPLANT
DRAPE C-ARM 42X120 X-RAY (DRAPES) IMPLANT
DRAPE LAPAROSCOPIC ABDOMINAL (DRAPES) ×3 IMPLANT
ELECT REM PT RETURN 9FT ADLT (ELECTROSURGICAL) ×3
ELECTRODE REM PT RTRN 9FT ADLT (ELECTROSURGICAL) ×1 IMPLANT
GLOVE SURG SIGNA 7.5 PF LTX (GLOVE) ×3 IMPLANT
GOWN STRL REUS W/TWL XL LVL3 (GOWN DISPOSABLE) ×6 IMPLANT
HEMOSTAT SURGICEL 4X8 (HEMOSTASIS) IMPLANT
KIT BASIN OR (CUSTOM PROCEDURE TRAY) ×3 IMPLANT
LIQUID BAND (GAUZE/BANDAGES/DRESSINGS) ×3 IMPLANT
PAD POSITIONING PINK XL (MISCELLANEOUS) ×3 IMPLANT
POSITIONER SURGICAL ARM (MISCELLANEOUS) IMPLANT
POUCH SPECIMEN RETRIEVAL 10MM (ENDOMECHANICALS) ×3 IMPLANT
SET CHOLANGIOGRAPH MIX (MISCELLANEOUS) IMPLANT
SET IRRIG TUBING LAPAROSCOPIC (IRRIGATION / IRRIGATOR) ×3 IMPLANT
STRIP CLOSURE SKIN 1/2X4 (GAUZE/BANDAGES/DRESSINGS) IMPLANT
SUT MNCRL AB 4-0 PS2 18 (SUTURE) ×3 IMPLANT
TAPE CLOTH 4X10 WHT NS (GAUZE/BANDAGES/DRESSINGS) IMPLANT
TOWEL OR 17X26 10 PK STRL BLUE (TOWEL DISPOSABLE) ×3 IMPLANT
TRAY LAPAROSCOPIC (CUSTOM PROCEDURE TRAY) ×3 IMPLANT
TROCAR BLADELESS OPT 5 75 (ENDOMECHANICALS) ×3 IMPLANT
TROCAR SLEEVE XCEL 5X75 (ENDOMECHANICALS) ×6 IMPLANT
TROCAR XCEL BLUNT TIP 100MML (ENDOMECHANICALS) ×3 IMPLANT

## 2016-03-09 NOTE — Anesthesia Postprocedure Evaluation (Signed)
Anesthesia Post Note  Patient: Taylor Dodson  Procedure(s) Performed: Procedure(s) (LRB): LAPAROSCOPIC CHOLECYSTECTOMY (N/A)  Patient location during evaluation: PACU Anesthesia Type: General Level of consciousness: awake and alert Pain management: pain level controlled Vital Signs Assessment: post-procedure vital signs reviewed and stable Respiratory status: spontaneous breathing, nonlabored ventilation, respiratory function stable and patient connected to nasal cannula oxygen Cardiovascular status: blood pressure returned to baseline and stable Postop Assessment: no signs of nausea or vomiting Anesthetic complications: no    Last Vitals:  Filed Vitals:   03/09/16 0923 03/09/16 1041  BP: 131/63 116/58  Pulse: 88 87  Temp: 36.3 C 36.3 C  Resp: 16 16    Last Pain:  Filed Vitals:   03/09/16 1044  PainSc: 0-No pain                 Micca Matura JENNETTE

## 2016-03-09 NOTE — Progress Notes (Signed)
Asst up to void. Patient still same

## 2016-03-09 NOTE — Anesthesia Procedure Notes (Signed)
Procedure Name: Intubation Date/Time: 03/09/2016 7:24 AM Performed by: Carleene Cooper A Pre-anesthesia Checklist: Patient identified, Emergency Drugs available, Suction available, Patient being monitored and Timeout performed Patient Re-evaluated:Patient Re-evaluated prior to inductionOxygen Delivery Method: Circle system utilized Preoxygenation: Pre-oxygenation with 100% oxygen Intubation Type: IV induction Ventilation: Mask ventilation without difficulty Laryngoscope Size: Mac and 3 Grade View: Grade II Tube type: Oral Number of attempts: 1 Airway Equipment and Method: Stylet Placement Confirmation: ETT inserted through vocal cords under direct vision,  positive ETCO2,  CO2 detector and breath sounds checked- equal and bilateral Secured at: 21 cm Tube secured with: Tape Dental Injury: Teeth and Oropharynx as per pre-operative assessment

## 2016-03-09 NOTE — Op Note (Signed)
Laparoscopic Cholecystectomy Procedure Note  Indications: This patient presents with symptomatic gallbladder disease and will undergo laparoscopic cholecystectomy.  Pre-operative Diagnosis: chronic cholecystitis with cholelithiasis  Post-operative Diagnosis: Same  Surgeon: Coralie Keens A   Assistants: 0  Anesthesia: General endotracheal anesthesia  ASA Class: 2  Procedure Details  The patient was seen again in the Holding Room. The risks, benefits, complications, treatment options, and expected outcomes were discussed with the patient. The possibilities of reaction to medication, pulmonary aspiration, perforation of viscus, bleeding, recurrent infection, finding a normal gallbladder, the need for additional procedures, failure to diagnose a condition, the possible need to convert to an open procedure, and creating a complication requiring transfusion or operation were discussed with the patient. The likelihood of improving the patient's symptoms with return to their baseline status is good.  The patient and/or family concurred with the proposed plan, giving informed consent. The site of surgery properly noted. The patient was taken to Operating Room, identified as Taylor Dodson and the procedure verified as Laparoscopic Cholecystectomy with Intraoperative Cholangiogram. A Time Out was held and the above information confirmed.  Prior to the induction of general anesthesia, antibiotic prophylaxis was administered. General endotracheal anesthesia was then administered and tolerated well. After the induction, the abdomen was prepped with Chloraprep and draped in sterile fashion. The patient was positioned in the supine position.  Local anesthetic agent was injected into the skin near the umbilicus and an incision made. We dissected down to the abdominal fascia with blunt dissection.  The fascia was incised vertically and we entered the peritoneal cavity bluntly.  A pursestring suture of 0-Vicryl  was placed around the fascial opening.  The Hasson cannula was inserted and secured with the stay suture.  Pneumoperitoneum was then created with CO2 and tolerated well without any adverse changes in the patient's vital signs. A 5-mm port was placed in the subxiphoid position.  Two 5-mm ports were placed in the right upper quadrant. All skin incisions were infiltrated with a local anesthetic agent before making the incision and placing the trocars.   We positioned the patient in reverse Trendelenburg, tilted slightly to the patient's left.  The gallbladder was identified and found to be distended and inflamed.  Clear bile was aspitated from the gallbladder in order to grasp it.  the fundus grasped and retracted cephalad. Adhesions were lysed bluntly and with the electrocautery where indicated, taking care not to injure any adjacent organs or viscus. The infundibulum was grasped and retracted laterally, exposing the peritoneum overlying the triangle of Calot. This was then divided and exposed in a blunt fashion. The cystic duct was clearly identified and bluntly dissected circumferentially. A critical view of the cystic duct and cystic artery was obtained.  The cystic duct was then ligated with clips and divided. The cystic artery was, dissected free, ligated with clips and divided as well.   The gallbladder was dissected from the liver bed in retrograde fashion with the electrocautery. The gallbladder was removed and placed in an Endocatch sac. The liver bed was irrigated and inspected. Hemostasis was achieved with the electrocautery. Copious irrigation was utilized and was repeatedly aspirated until clear.  The gallbladder and Endocatch sac were then removed through the umbilical port site.  The pursestring suture was used to close the umbilical fascia.    We again inspected the right upper quadrant for hemostasis.  Pneumoperitoneum was released as we removed the trocars.  4-0 Monocryl was used to close the  skin.   Sink glue was  then applied. The patient was then extubated and brought to the recovery room in stable condition. Instrument, sponge, and needle counts were correct at closure and at the conclusion of the case.   Findings: Chronic Cholecystitis with Cholelithiasis  Estimated Blood Loss: Minimal         Drains: 0         Specimens: Gallbladder           Complications: None; patient tolerated the procedure well.         Disposition: PACU - hemodynamically stable.         Condition: stable

## 2016-03-09 NOTE — Transfer of Care (Signed)
Immediate Anesthesia Transfer of Care Note  Patient: Taylor Dodson  Procedure(s) Performed: Procedure(s): LAPAROSCOPIC CHOLECYSTECTOMY (N/A)  Patient Location: PACU  Anesthesia Type:General  Level of Consciousness: awake, alert , oriented and patient cooperative  Airway & Oxygen Therapy: Patient Spontanous Breathing and Patient connected to face mask oxygen  Post-op Assessment: Report given to RN, Post -op Vital signs reviewed and stable and Patient moving all extremities  Post vital signs: Reviewed and stable  Last Vitals:  Filed Vitals:   03/09/16 0526  BP: 133/50  Pulse: 100  Temp: 36.5 C  Resp: 20    Complications: No apparent anesthesia complications

## 2016-03-09 NOTE — Discharge Instructions (Signed)
[facility] ______[facility] LAPAROSCOPIC SURGERY: POST OP INSTRUCTIONS Always review your discharge instruction sheet given to you by the facility where your surgery was performed. IF YOU HAVE DISABILITY OR FAMILY LEAVE FORMS, YOU MUST BRING THEM TO THE OFFICE FOR PROCESSING.   DO NOT GIVE THEM TO YOUR DOCTOR.  1. A prescription for pain medication may be given to you upon discharge.  Take your pain medication as prescribed, if needed.  If narcotic pain medicine is not needed, then you may take acetaminophen (Tylenol) or ibuprofen (Advil) as needed. 2. Take your usually prescribed medications unless otherwise directed. 3. If you need a refill on your pain medication, please contact your pharmacy.  They will contact our office to request authorization. Prescriptions will not be filled after 5pm or on week-ends. 4. You should follow a light diet the first few days after arrival home, such as soup and crackers, etc.  Be sure to include lots of fluids daily. 5. Most patients will experience some swelling and bruising in the area of the incisions.  Ice packs will help.  Swelling and bruising can take several days to resolve.  6. It is common to experience some constipation if taking pain medication after surgery.  Increasing fluid intake and taking a stool softener (such as Colace) will usually help or prevent this problem from occurring.  A mild laxative (Milk of Magnesia or Miralax) should be taken according to package instructions if there are no bowel movements after 48 hours. 7. Unless discharge instructions indicate otherwise, you may remove your bandages 24-48 hours after surgery, and you may shower at that time.  You may have steri-strips (small skin tapes) in place directly over the incision.  These strips should be left on the skin for 7-10 days.  If your surgeon used skin glue on the incision, you may shower in 24 hours.  The glue will flake off over the next 2-3 weeks.  Any sutures or  staples will be removed at the office during your follow-up visit. 8. ACTIVITIES:  You may resume regular (light) daily activities beginning the next day--such as daily self-care, walking, climbing stairs--gradually increasing activities as tolerated.  You may have sexual intercourse when it is comfortable.  Refrain from any heavy lifting or straining until approved by your doctor. a. You may drive when you are no longer taking prescription pain medication, you can comfortably wear a seatbelt, and you can safely maneuver your car and apply brakes. b. RETURN TO WORK:  __________________________________________________________ 9. You should see your doctor in the office for a follow-up appointment approximately 2-3 weeks after your surgery.  Make sure that you call for this appointment within a day or two after you arrive home to insure a convenient appointment time. 10. OTHER INSTRUCTIONS:NO LIFTING MORE THAN 15 TO 20 POUNDS FOR 2 WEEKS 11. OK TO SHOWER TOMORROW 12. ICE PACK AND IBUPROFEN AND TYLENOL ALSO FOR PAIN __________________________________________________________________________________________________________________________ __________________________________________________________________________________________________________________________ WHEN TO CALL YOUR DOCTOR: 1. Fever over 101.0 2. Inability to urinate 3. Continued bleeding from incision. 4. Increased pain, redness, or drainage from the incision. 5. Increasing abdominal pain  The clinic staff is available to answer your questions during regular business hours.  Please dont hesitate to call and ask to speak to one of the nurses for clinical concerns.  If you have a medical emergency, go to the nearest emergency room or call 911.  A surgeon from [facility] is always on call at the hospital. [address]  [place], [place], [place]  [address] ? [address], [place], [place]   [address] [phone] ?  [phone] ? FAX [phone] Web site: [email or web address]      General Anesthesia, Adult, Care After Refer to this sheet in the next few weeks. These instructions provide you with information on caring for yourself after your procedure. Your health care provider may also give you more specific instructions. Your treatment has been planned according to current medical practices, but problems sometimes occur. Call your health care provider if you have any problems or questions after your procedure. WHAT TO EXPECT AFTER THE PROCEDURE After the procedure, it is typical to experience:  Sleepiness.  Nausea and vomiting. HOME CARE INSTRUCTIONS  For the first 24 hours after general anesthesia:  Have a responsible person with you.  Do not drive a car. If you are alone, do not take public transportation.  Do not drink alcohol.  Do not take medicine that has not been prescribed by your health care provider.  Do not sign important papers or make important decisions.  You may resume a normal diet and activities as directed by your health care provider.  If you have questions or problems that seem related to general anesthesia, call the hospital and ask for the anesthetist or anesthesiologist on call. SEEK MEDICAL CARE IF:  You have nausea and vomiting that continue the day after anesthesia.  You develop a rash. SEEK IMMEDIATE MEDICAL CARE IF:   You have difficulty breathing.  You have chest pain.  You have any allergic problems.   This information is not intended to replace advice given to you by your health care provider. Make sure you discuss any questions you have with your health care provider.   Document Released: 03/25/2001 Document Revised: 01/07/2015 Document Reviewed: 04/16/2012 Elsevier Interactive Patient Education [employer].

## 2016-03-09 NOTE — Interval H&P Note (Signed)
History and Physical Interval Note:no change in H and P  03/09/2016 6:59 AM  Taylor Dodson  has presented today for surgery, with the diagnosis of Chronic cholecystitis with gallstones  The various methods of treatment have been discussed with the patient and family. After consideration of risks, benefits and other options for treatment, the patient has consented to  Procedure(s): LAPAROSCOPIC CHOLECYSTECTOMY (N/A) as a surgical intervention .  The patient's history has been reviewed, patient examined, no change in status, stable for surgery.  I have reviewed the patient's chart and labs.  Questions were answered to the patient's satisfaction.     Reginal Wojcicki A

## 2016-03-09 NOTE — Anesthesia Preprocedure Evaluation (Signed)
Anesthesia Evaluation  Patient identified by MRN, date of birth, ID band Patient awake    Reviewed: Allergy & Precautions, NPO status , Patient's Chart, lab work & pertinent test results  History of Anesthesia Complications Negative for: history of anesthetic complications  Airway Mallampati: II  TM Distance: >3 FB Neck ROM: Full    Dental no notable dental hx. (+) Dental Advisory Given   Pulmonary asthma ,    Pulmonary exam normal breath sounds clear to auscultation       Cardiovascular negative cardio ROS Normal cardiovascular exam Rhythm:Regular Rate:Normal     Neuro/Psych PSYCHIATRIC DISORDERS Anxiety negative neurological ROS     GI/Hepatic negative GI ROS, Neg liver ROS,   Endo/Other  negative endocrine ROS  Renal/GU negative Renal ROS  negative genitourinary   Musculoskeletal negative musculoskeletal ROS (+)   Abdominal   Peds negative pediatric ROS (+)  Hematology negative hematology ROS (+)   Anesthesia Other Findings   Reproductive/Obstetrics negative OB ROS                             Anesthesia Physical Anesthesia Plan  ASA: II  Anesthesia Plan: General   Post-op Pain Management:    Induction: Intravenous  Airway Management Planned: Oral ETT  Additional Equipment:   Intra-op Plan:   Post-operative Plan: Extubation in OR  Informed Consent: I have reviewed the patients History and Physical, chart, labs and discussed the procedure including the risks, benefits and alternatives for the proposed anesthesia with the patient or authorized representative who has indicated his/her understanding and acceptance.   Dental advisory given  Plan Discussed with: CRNA  Anesthesia Plan Comments:         Anesthesia Quick Evaluation

## 2016-03-09 NOTE — Progress Notes (Signed)
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 A little nauseated and unsteady while on feet. Felt a little dizzy while up

## 2016-03-09 NOTE — Progress Notes (Signed)
States queasiness is gone. "Ready to go".

## 2016-03-28 ENCOUNTER — Other Ambulatory Visit: Payer: Self-pay | Admitting: Family Medicine

## 2016-06-01 IMAGING — US US ABDOMEN COMPLETE
1 series · 14 of 25 positions shown · non-contrast
Comparison: 03/02/2015 .

CLINICAL DATA: Epigastric pain.

EXAM:
ABDOMEN ULTRASOUND COMPLETE

[Series 1: us abdomen complete · 0.20mm/px · 14 of 88 slices shown]
[im 1/88]
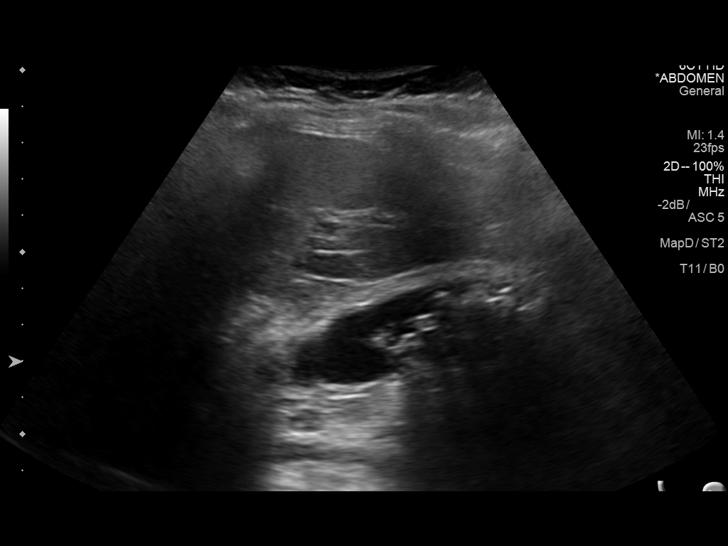
[im 8/88]
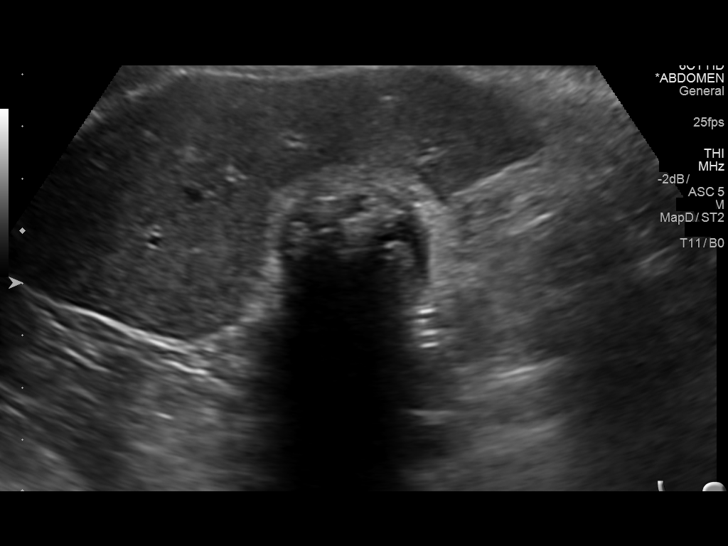
[im 15/88]
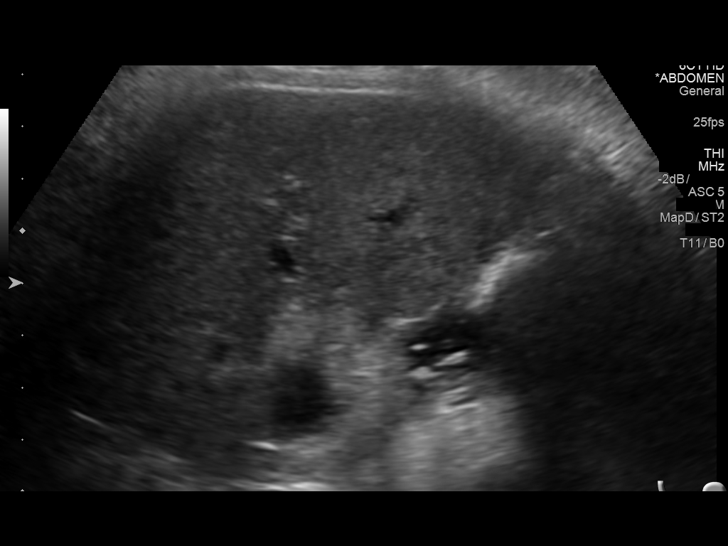
[im 22/88]
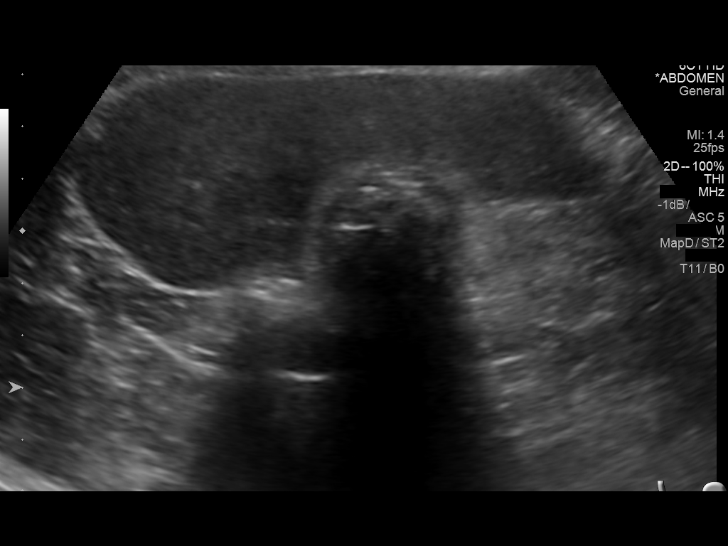
[im 30/88]
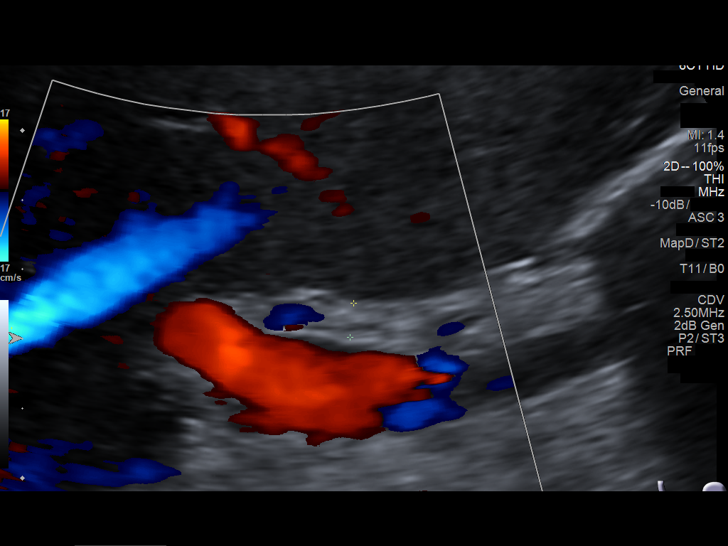
[im 33/88]
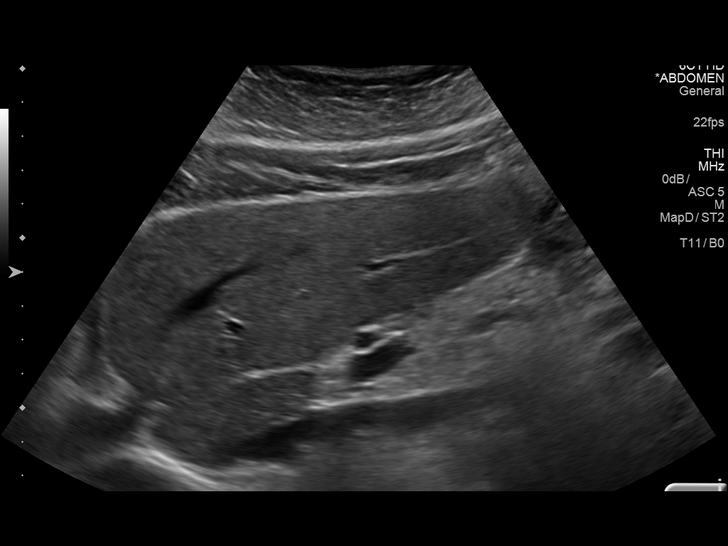
[im 40/88]
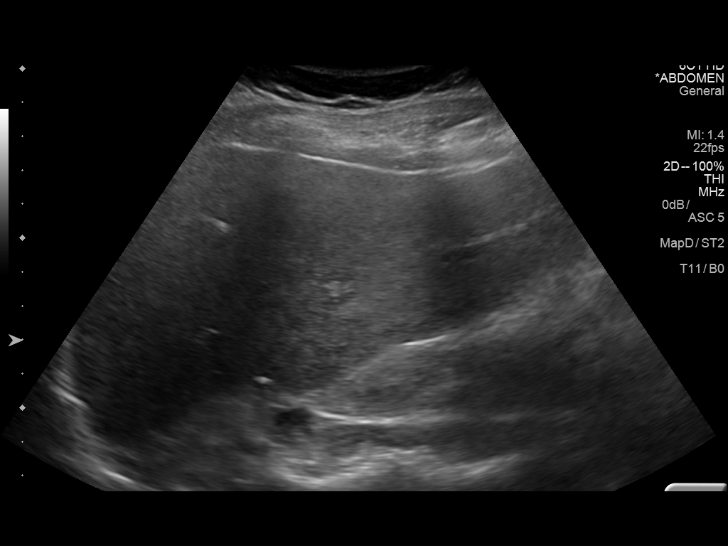
[im 48/88]
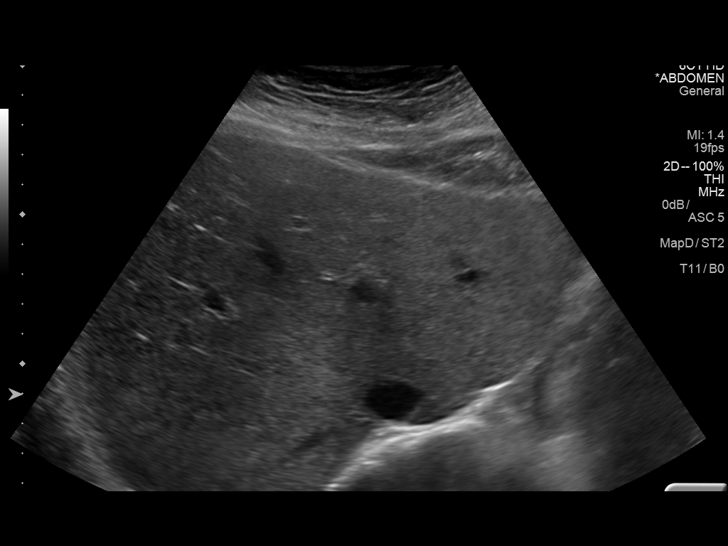
[im 55/88]
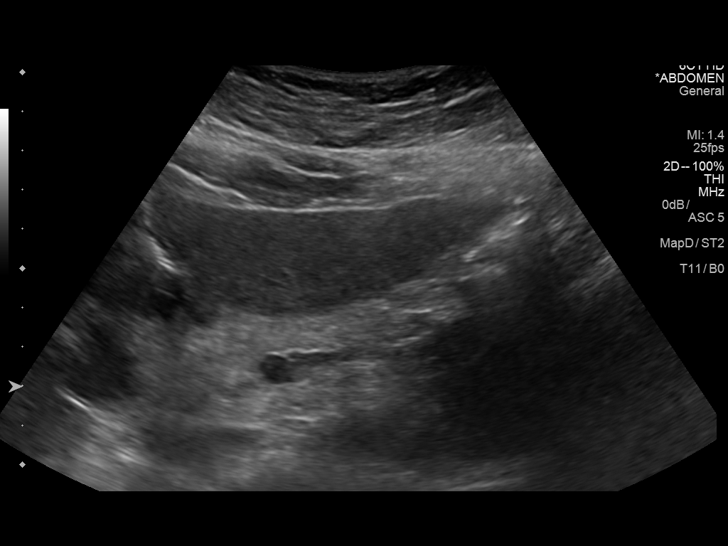
[im 59/88]
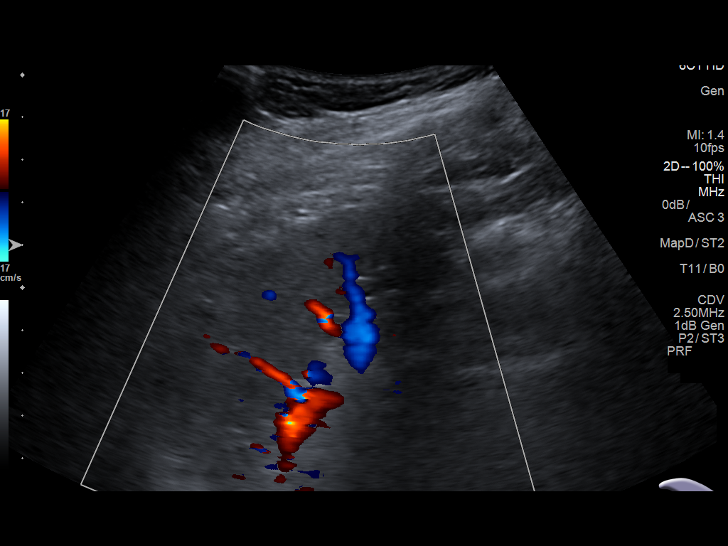
[im 66/88]
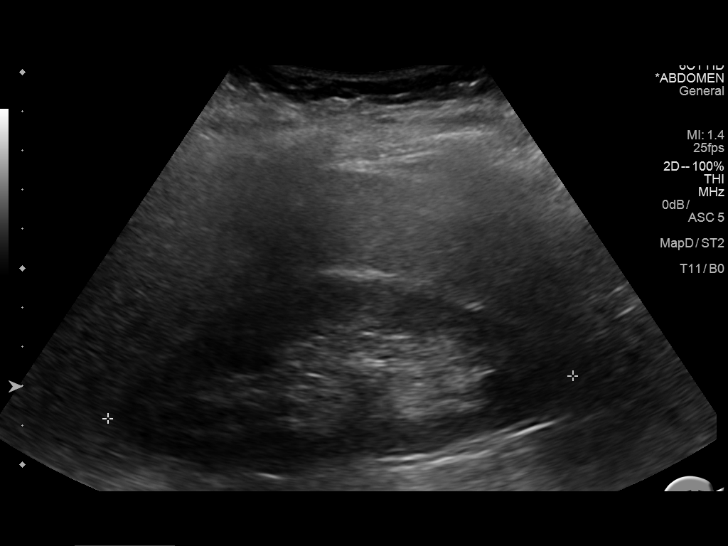
[im 73/88]
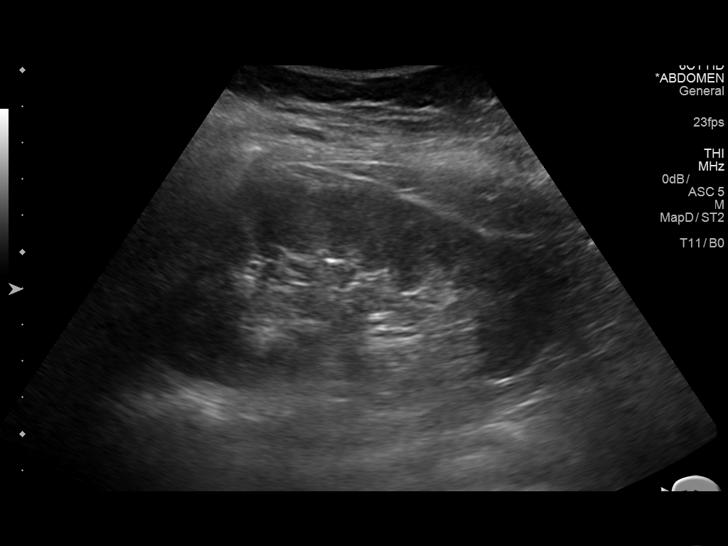
[im 80/88]
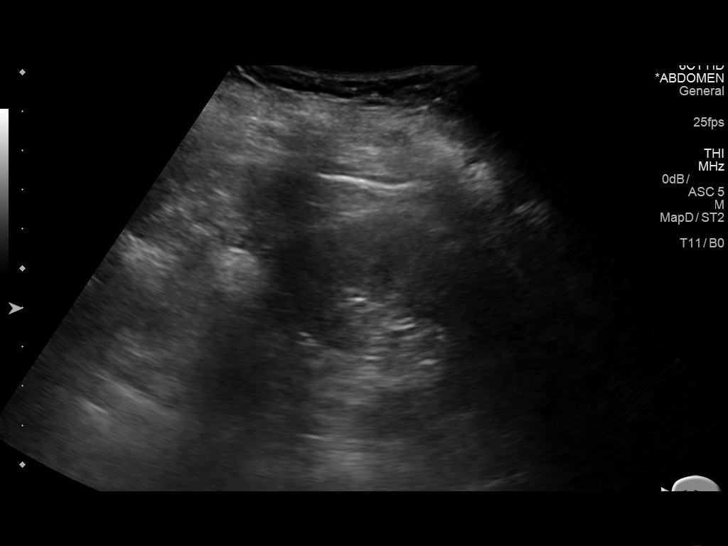
[im 88/88]
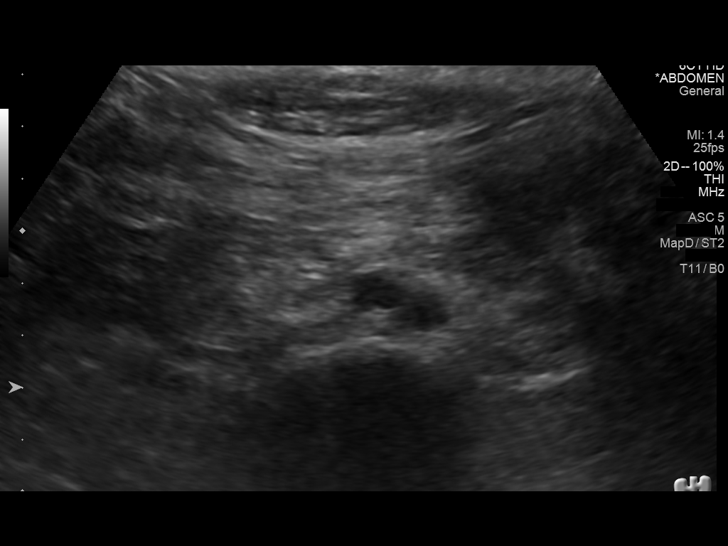

[14 of 25 positions shown; findings below may reference images not displayed]

FINDINGS: Gallbladder: Multiple gallstones. Gallbladder wall is thickened at
4.7 mm. Cholecystitis cannot be excluded. Negative Murphy sign. No
pericholecystic fluid collections.

Common bile duct: Diameter: 4.9 mm

Liver: No focal lesion identified. Within normal limits in
parenchymal echogenicity.

IVC: No abnormality visualized.

Pancreas: Visualized portion unremarkable.

Spleen: Size and appearance within normal limits.

Right Kidney: Length: 11.9 cm. Echogenicity within normal limits. No
mass or hydronephrosis visualized.

Left Kidney: Length: 11.4 cm. Echogenicity within normal limits. No
mass or hydronephrosis visualized.

Abdominal aorta: No aneurysm visualized.

Other findings: None.
IMPRESSION: 1 multiple gallstones. Gallbladder wall thickening at 4.7 mm.
Cholecystitis cannot be excluded.

## 2016-08-16 ENCOUNTER — Other Ambulatory Visit: Payer: Self-pay | Admitting: Family Medicine

## 2016-08-22 ENCOUNTER — Other Ambulatory Visit: Payer: Self-pay | Admitting: Family Medicine

## 2016-08-23 ENCOUNTER — Telehealth: Payer: Self-pay | Admitting: Family Medicine

## 2016-08-23 DIAGNOSIS — E785 Hyperlipidemia, unspecified: Secondary | ICD-10-CM

## 2016-08-23 NOTE — Telephone Encounter (Signed)
Pt.notified

## 2016-08-23 NOTE — Telephone Encounter (Signed)
Pt called. She has an appt scheduled for Sept 5th  for refill on meds and is requesting lab order be sent down for Thyroid and cholesterol labs. She wants order sent down the morning before her appt. Thank you.

## 2016-08-23 NOTE — Telephone Encounter (Signed)
Labs in [place] in box.

## 2016-08-24 LAB — LIPID PANEL
CHOL/HDL RATIO: 2.3 ratio (ref <=5.0)
CHOLESTEROL: 177 mg/dL (ref 125–200)
HDL: 76 mg/dL (ref >=46)
LDL Cholesterol: 82 mg/dL (ref <130)
Triglycerides: 95 mg/dL (ref <150)
VLDL: 19 mg/dL (ref <30)

## 2016-08-24 LAB — TSH: TSH: 2.35 m[IU]/L

## 2016-09-04 ENCOUNTER — Ambulatory Visit (INDEPENDENT_AMBULATORY_CARE_PROVIDER_SITE_OTHER): Payer: Managed Care, Other (non HMO) | Admitting: Family Medicine

## 2016-09-04 ENCOUNTER — Encounter: Payer: Self-pay | Admitting: Family Medicine

## 2016-09-04 VITALS — BP 132/76 | HR 87 | Wt 153.0 lb

## 2016-09-04 DIAGNOSIS — E785 Hyperlipidemia, unspecified: Secondary | ICD-10-CM | POA: Diagnosis not present

## 2016-09-04 MED ORDER — PRAVASTATIN SODIUM 40 MG PO TABS: 40.0000 mg | ORAL_TABLET | Freq: Every day | ORAL | 1 refills | Status: DC

## 2016-09-04 NOTE — Progress Notes (Signed)
CC: Taylor Dodson is a 60 y.o. female is here for Medication Refill   Subjective: HPI:  Follow-up hyperlipidemia: At this and she had her gallbladder taken out she's drastically reduce the amount of fatty foods that she is eating. She is reduced on most all processed food and cut out a significant amount of cheese intake. She had her cholesterol checked last week and had a favorable LDL, HDL, triglycerides and total cholesterol. She wants know if she can cut back on her Pravachol and just start taking 40 mg daily. She denies any known side effects. She denies any chest pain or limb claudication. no right upper quadrant pain or myalgia   Review Of Systems Outlined In HPI  Past Medical History:  Diagnosis Date  . Asthma    pt states was informed had workplace asthma states since moving to [place] has not had any difficulties  . History of bronchitis   . Hyperlipidemia     Past Surgical History:  Procedure Laterality Date  . CHOLECYSTECTOMY N/A 03/09/2016   Procedure: LAPAROSCOPIC CHOLECYSTECTOMY;  Surgeon: Coralie Keens, MD;  Location: [facility];  Service: General;  Laterality: N/A;  . neck tumor     benign  . TONSILLECTOMY     Family History  Problem Relation Age of Onset  . Cancer Mother     pancreatic  . Stroke Father   . Hypertension Father   . Mitral valve prolapse Father     Social History   Social History  . Marital status: Married    Spouse name: N/A  . Number of children: N/A  . Years of education: N/A   Occupational History  . progran analyst    Social History Main Topics  . Smoking status: Never Smoker  . Smokeless tobacco: Never Used  . Alcohol use No  . Drug use: No  . Sexual activity: Yes    Partners: Male   Other Topics Concern  . Not on file   Social History Narrative  . No narrative on file     Objective: BP 132/76   Pulse 87   Wt 153 lb (69.4 kg)   BMI 23.61 kg/m   Vital signs reviewed. General: Alert and Oriented, No Acute Distress HEENT:  Pupils equal, round, reactive to light. Conjunctivae clear.  External ears unremarkable.  Moist mucous membranes. Lungs: Clear and comfortable work of breathing, speaking in full sentences without accessory muscle use. Cardiac: Regular rate and rhythm.  Neuro: CN II-XII grossly intact, gait normal. Extremities: No peripheral edema.  Strong peripheral pulses.  Mental Status: No depression, anxiety, nor agitation. Logical though process. Skin: Warm and dry.  Assessment & Plan: Taylor Dodson was seen today for medication refill.  Diagnoses and all orders for this visit:  Hyperlipidemia  Other orders -     pravastatin (PRAVACHOL) 40 MG tablet; Take 1 tablet (40 mg total) by mouth daily.   Her proposal to cut back on pravastatin seems reasonable.She'll be having a cholesterol screening provided by her employer in late October I asked her to bring this in during a follow-up visit in either November or December with the provider of her choice.   Discussed with this patient that I will be resigning from my position here with [employer] in September in order to stay with my family who will be moving to [facility]. I let him know about the providers that are still accepting patients and I feel that this individual will be under great care if he/she stays  here with [facility].  Return in about 3 months (around 12/04/2016).

## 2016-09-19 ENCOUNTER — Other Ambulatory Visit: Payer: Self-pay | Admitting: Family Medicine

## 2016-10-07 ENCOUNTER — Other Ambulatory Visit: Payer: Self-pay | Admitting: Family Medicine

## 2017-01-09 ENCOUNTER — Other Ambulatory Visit: Payer: Self-pay | Admitting: Osteopathic Medicine

## 2017-01-11 ENCOUNTER — Other Ambulatory Visit: Payer: Self-pay | Admitting: Osteopathic Medicine

## 2017-01-23 ENCOUNTER — Ambulatory Visit (INDEPENDENT_AMBULATORY_CARE_PROVIDER_SITE_OTHER): Payer: Managed Care, Other (non HMO) | Admitting: Family Medicine

## 2017-01-23 ENCOUNTER — Encounter: Payer: Self-pay | Admitting: Family Medicine

## 2017-01-23 VITALS — BP 128/78 | HR 94 | Resp 12 | Ht 67.5 in | Wt 155.2 lb

## 2017-01-23 DIAGNOSIS — E785 Hyperlipidemia, unspecified: Secondary | ICD-10-CM | POA: Diagnosis not present

## 2017-01-23 DIAGNOSIS — B001 Herpesviral vesicular dermatitis: Secondary | ICD-10-CM

## 2017-01-23 DIAGNOSIS — Z0000 Encounter for general adult medical examination without abnormal findings: Secondary | ICD-10-CM

## 2017-01-23 LAB — LIPID PANEL
Cholesterol: 168 mg/dL (ref 0–200)
HDL: 55.7 mg/dL (ref >39.00)
LDL Cholesterol: 76 mg/dL (ref 0–99)
NONHDL: 112.22
TRIGLYCERIDES: 181 mg/dL — AB (ref 0.0–149.0)
Total CHOL/HDL Ratio: 3
VLDL: 36.2 mg/dL (ref 0.0–40.0)

## 2017-01-23 LAB — COMPREHENSIVE METABOLIC PANEL
ALBUMIN: 4.5 g/dL (ref 3.5–5.2)
ALK PHOS: 74 U/L (ref 39–117)
ALT: 20 U/L (ref 0–35)
AST: 19 U/L (ref 0–37)
BUN: 11 mg/dL (ref 6–23)
CALCIUM: 9.6 mg/dL (ref 8.4–10.5)
CO2: 29 mEq/L (ref 19–32)
Chloride: 106 mEq/L (ref 96–112)
Creatinine, Ser: 0.68 mg/dL (ref 0.40–1.20)
GFR: 93.83 mL/min (ref >60.00)
Glucose, Bld: 94 mg/dL (ref 70–99)
Potassium: 4.3 mEq/L (ref 3.5–5.1)
Sodium: 142 mEq/L (ref 135–145)
TOTAL PROTEIN: 6.9 g/dL (ref 6.0–8.3)
Total Bilirubin: 0.3 mg/dL (ref 0.2–1.2)

## 2017-01-23 MED ORDER — ACYCLOVIR 400 MG PO TABS: 200.0000 mg | ORAL_TABLET | Freq: Every day | ORAL | 1 refills | Status: DC

## 2017-01-23 NOTE — Progress Notes (Signed)
Pre visit review using our clinic review tool, if applicable. No additional management support is needed unless otherwise documented below in the visit note. 

## 2017-01-23 NOTE — Progress Notes (Signed)
HPI:   Taylor Dodson is a 61 y.o. female, who is here today to establish care with me.  Former PCP: Dr Ileene Rubens.  Last preventive routine visit: 1-2 years ago.She follows with her gyn regularly for her gynecologic preventive care. Colonoscopy in 2014 and her gyn test for fecal blood annually. Reporting vaccines as up to date, including influenza vaccine.  She exercises regularly, biking and walking; she also follows a healthy diet.   Hx of hyperlipidemia:  Currently on Pravastatin 60 mg daily. After last lab work it was recommended decreasing dose from 60 to 40 mg but she continue same dose. Denies Hx of CVD, + FHx (father CVA's starting in his early 2's). She takes Aspirin 81 mg daily.  She has not noted side effects with medication.  Lab Results  Component Value Date   CHOL 177 08/23/2016   HDL 76 08/23/2016   LDLCALC 82 08/23/2016   TRIG 95 08/23/2016   CHOLHDL 2.3 08/23/2016   Hx of fever blisters, she is on Acyclovir 200 mg daily, which she has taken for over a year. She has not had a flare up in a while, non since taking medication.     Review of Systems  Constitutional: Negative for activity change, appetite change, fatigue, fever and unexpected weight change.  HENT: Negative for dental problem, mouth sores, nosebleeds and trouble swallowing.   Eyes: Negative for redness and visual disturbance.  Respiratory: Negative for cough, shortness of breath and wheezing.   Cardiovascular: Negative for chest pain, palpitations and leg swelling.  Gastrointestinal: Negative for abdominal pain, nausea and vomiting.       Negative for changes in bowel habits.  Endocrine: Negative for cold intolerance, heat intolerance, polydipsia, polyphagia and polyuria.  Genitourinary: Negative for decreased urine volume, difficulty urinating and hematuria.  Musculoskeletal: Negative for gait problem and myalgias.  Skin: Negative for rash.  Neurological: Negative for syncope,  weakness and headaches.  Hematological: Negative for adenopathy. Does not bruise/bleed easily.  Psychiatric/Behavioral: Negative for confusion. The patient is not nervous/anxious.       Current Outpatient Prescriptions on File Prior to Visit  Medication Sig Dispense Refill  . aspirin 81 MG tablet Take 81 mg by mouth daily.     . Multiple Vitamin (MULTIVITAMIN) tablet Take 1 tablet by mouth daily.     . pravastatin (PRAVACHOL) 40 MG tablet Take 1.5 tablets (60 mg total) by mouth daily. NEED APPOINTMENT WITH NEW PCP FOR MORE REFILLS 21 tablet 0   No current facility-administered medications on file prior to visit.      Past Medical History:  Diagnosis Date  . Anxiety   . Asthma    pt states was informed had workplace asthma states since moving to [place] has not had any difficulties  . History of bronchitis   . Hyperlipidemia    Allergies  Allergen Reactions  . Augmentin [Amoxicillin-Pot Clavulanate] Hives and Shortness Of Breath    .Marland KitchenHas patient had a PCN reaction causing immediate rash, facial/tongue/throat swelling, SOB or lightheadedness with hypotension: Yes Has patient had a PCN reaction causing severe rash involving mucus membranes or skin necrosis: No Has patient had a PCN reaction that required hospitalization No Has patient had a PCN reaction occurring within the last 10 years: No If all of the above answers are "NO", then may proceed with Cephalosporin use.   Loma Sousa [Escitalopram Oxalate] Rash    Red blotching on face and neck,rash    Family History  Problem  Relation Age of Onset  . Cancer Mother     pancreatic  . Stroke Father   . Hypertension Father   . Mitral valve prolapse Father   . Hyperlipidemia Brother     Social History   Social History  . Marital status: Married    Spouse name: N/A  . Number of children: N/A  . Years of education: N/A   Occupational History  . progran analyst    Social History Main Topics  . Smoking status: Never Smoker  .  Smokeless tobacco: Never Used  . Alcohol use No  . Drug use: No  . Sexual activity: Yes    Partners: Male   Other Topics Concern  . None   Social History Narrative  . None    Vitals:   01/23/17 0750  BP: 128/78  Pulse: 94  Resp: 12   O2 sat at RA 98%  Body mass index is 23.96 kg/m.    Physical Exam  Nursing note and vitals reviewed. Constitutional: She is oriented to person, place, and time. She appears well-developed and well-nourished. No distress.  HENT:  Head: Atraumatic.  Mouth/Throat: Oropharynx is clear and moist and mucous membranes are normal.  Eyes: Conjunctivae and EOM are normal. Pupils are equal, round, and reactive to light.  Neck: No tracheal deviation present. No thyroid mass and no thyromegaly present.  Cardiovascular: Normal rate and regular rhythm.   No murmur heard. Pulses:      Dorsalis pedis pulses are 2+ on the right side, and 2+ on the left side.  Respiratory: Effort normal and breath sounds normal. No respiratory distress.  GI: Soft. She exhibits no mass. There is no hepatomegaly. There is no tenderness.  Musculoskeletal: She exhibits no edema.  Lymphadenopathy:    She has no cervical adenopathy.  Neurological: She is alert and oriented to person, place, and time. She has normal strength. Coordination and gait normal.  Skin: Skin is warm. No erythema.  Psychiatric: She has a normal mood and affect.  Well groomed, good eye contact.      ASSESSMENT AND PLAN:     Taylor Dodson was seen today for establish care.  Diagnoses and all orders for this visit:   Hyperlipidemia, unspecified hyperlipidemia type  No changes in current management, will follow labs done today and will give further recommendations accordingly. Will consider decreasing dose of Pravastatin from 60 mg to 40 mg if lipid panel still in normal range. Continue low fat diet. F/U in 6-12 months.  -     Comprehensive metabolic panel -     Lipid panel  Recurrent herpes  labialis  Recommend stopping Acyclovir and monitor for recurrences. I sent Rx as she is currently taking medication, she will let me know what she decides to do and/or if she has an acute episode. F/U annually.  -     acyclovir (ZOVIRAX) 400 MG tablet; Take 0.5 tablets (200 mg total) by mouth daily.  Healthcare maintenance  We discussed benefits of Aspirin for primary prevention as well as side effects. She has colonoscopy,mammogram,pap,and vaccines up to date. We discussed zoster vaccination, benefits and risks.We discussed advantages of new vaccine,whihc we do not have yet. I will see her annually (before if needed) and she weill continue following with her gyn.        Rosmarie Esquibel G. [place], MD  [facility] [place] office.

## 2017-01-23 NOTE — Patient Instructions (Signed)
A few things to remember from today's visit:   Hyperlipidemia, unspecified hyperlipidemia type - Plan: Comprehensive metabolic panel, Lipid panel  No changes today.   Please be sure medication list is accurate. If a new problem present, please set up appointment sooner than planned today.

## 2017-01-25 ENCOUNTER — Encounter: Payer: Self-pay | Admitting: Family Medicine

## 2017-02-04 ENCOUNTER — Other Ambulatory Visit: Payer: Self-pay

## 2017-02-04 MED ORDER — PRAVASTATIN SODIUM 40 MG PO TABS: 60.0000 mg | ORAL_TABLET | Freq: Every day | ORAL | 1 refills | Status: DC

## 2017-02-12 ENCOUNTER — Telehealth: Payer: Self-pay | Admitting: Family Medicine

## 2017-02-12 ENCOUNTER — Other Ambulatory Visit: Payer: Self-pay

## 2017-02-12 MED ORDER — PRAVASTATIN SODIUM 40 MG PO TABS: 60.0000 mg | ORAL_TABLET | Freq: Every day | ORAL | 1 refills | Status: DC

## 2017-02-12 NOTE — Telephone Encounter (Signed)
Pt would like to see if you could resend pravastatin to [facility] pt state that the pharmacy has not rec'd this Rx.

## 2017-02-12 NOTE — Telephone Encounter (Signed)
Rx resent.

## 2017-10-08 ENCOUNTER — Other Ambulatory Visit (HOSPITAL_COMMUNITY): Payer: Self-pay | Admitting: Obstetrics & Gynecology

## 2017-10-08 DIAGNOSIS — Z1239 Encounter for other screening for malignant neoplasm of breast: Secondary | ICD-10-CM

## 2017-10-23 ENCOUNTER — Ambulatory Visit (INDEPENDENT_AMBULATORY_CARE_PROVIDER_SITE_OTHER): Payer: Managed Care, Other (non HMO)

## 2017-10-23 ENCOUNTER — Ambulatory Visit (INDEPENDENT_AMBULATORY_CARE_PROVIDER_SITE_OTHER): Payer: Managed Care, Other (non HMO) | Admitting: Obstetrics & Gynecology

## 2017-10-23 ENCOUNTER — Encounter: Payer: Self-pay | Admitting: Obstetrics & Gynecology

## 2017-10-23 VITALS — BP 138/76 | HR 101 | Ht 67.0 in | Wt 159.0 lb

## 2017-10-23 DIAGNOSIS — Z01419 Encounter for gynecological examination (general) (routine) without abnormal findings: Secondary | ICD-10-CM | POA: Diagnosis not present

## 2017-10-23 DIAGNOSIS — Z1151 Encounter for screening for human papillomavirus (HPV): Secondary | ICD-10-CM | POA: Diagnosis not present

## 2017-10-23 DIAGNOSIS — Z1231 Encounter for screening mammogram for malignant neoplasm of breast: Secondary | ICD-10-CM | POA: Diagnosis not present

## 2017-10-23 DIAGNOSIS — Z124 Encounter for screening for malignant neoplasm of cervix: Secondary | ICD-10-CM | POA: Diagnosis not present

## 2017-10-23 DIAGNOSIS — R635 Abnormal weight gain: Secondary | ICD-10-CM

## 2017-10-23 DIAGNOSIS — Z1239 Encounter for other screening for malignant neoplasm of breast: Secondary | ICD-10-CM

## 2017-10-23 LAB — TSH: TSH: 1.95 mIU/L (ref 0.40–4.50)

## 2017-10-23 NOTE — Progress Notes (Signed)
0Subjective:    Taylor Dodson is a 61 y.o. MW P34female who presents for an annual exam. The patient has no complaints today. The patient is not currently sexually active. GYN screening history: last pap: was normal. The patient wears seatbelts: yes. The patient participates in regular exercise: yes. Has the patient ever been transfused or tattooed?: no. The patient reports that there is not domestic violence in her life.   Menstrual History: OB History    Gravida Para Term Preterm AB Living   0 0 0 0 0 0   SAB TAB Ectopic Multiple Live Births   0 0 0 0        Menarche age: 32 No LMP recorded. Patient is postmenopausal.    The following portions of the patient's history were reviewed and updated as appropriate: allergies, current medications, past family history, past medical history, past social history, past surgical history and problem list.  Review of Systems Pertinent items are noted in HPI.   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 FH- no breast/gyn/colon cancer + pancreas cancer in her mom Flu shot next week at work Mammogram today Colonoscopy 5 years ago   Objective:    BP 138/76   Pulse (!) 101   Ht 5\' 7"  (1.702 m)   Wt 159 lb (72.1 kg)   BMI 24.90 kg/m   General Appearance:    Alert, cooperative, no distress, appears stated age  Head:    Normocephalic, without obvious abnormality, atraumatic  Eyes:    PERRL, conjunctiva/corneas clear, EOM's intact, fundi    benign, both eyes  Ears:    Normal TM's and external ear canals, both ears  Nose:   Nares normal, septum midline, mucosa normal, no drainage    or sinus tenderness  Throat:   Lips, mucosa, and tongue normal; teeth and gums normal  Neck:   Supple, symmetrical, trachea midline, no adenopathy;    thyroid:  no enlargement/tenderness/nodules; no carotid   bruit or JVD  Back:     Symmetric, no curvature, ROM normal, no CVA  tenderness  Lungs:     Clear to auscultation bilaterally, respirations unlabored  Chest Wall:    No tenderness or deformity   Heart:    Regular rate and rhythm, S1 and S2 normal, no murmur, rub   or gallop  Breast Exam:    No tenderness, masses, or nipple abnormality  Abdomen:     Soft, non-tender, bowel sounds active all four quadrants,    no masses, no organomegaly  Genitalia:    Vulvovaginal atrophy, excoriations above clitorus, white area of her right labia minora, nulliparous cervix     Extremities:   Extremities normal, atraumatic, no cyanosis or edema  Pulses:   2+ and symmetric all extremities  Skin:   Skin color, texture, turgor normal, no rashes or lesions  Lymph nodes:   Cervical, supraclavicular, and axillary nodes normal  Neurologic:   CNII-XII intact, normal strength, sensation and reflexes    throughout  .    Assessment:    Healthy female exam.   Vulvar lesions Weight gain   Plan:     Thin prep Pap smear. with cotesting Schedule vulvar biopsy (s) TSH today

## 2017-10-25 LAB — CYTOLOGY - PAP
Diagnosis: NEGATIVE
HPV: NOT DETECTED

## 2017-11-26 ENCOUNTER — Ambulatory Visit: Payer: Managed Care, Other (non HMO) | Admitting: Obstetrics & Gynecology

## 2017-12-02 ENCOUNTER — Encounter: Payer: Self-pay | Admitting: Obstetrics & Gynecology

## 2017-12-02 ENCOUNTER — Ambulatory Visit (INDEPENDENT_AMBULATORY_CARE_PROVIDER_SITE_OTHER): Payer: Managed Care, Other (non HMO) | Admitting: Obstetrics & Gynecology

## 2017-12-02 VITALS — BP 126/71 | HR 98 | Resp 16 | Ht 67.0 in | Wt 159.0 lb

## 2017-12-02 DIAGNOSIS — N9089 Other specified noninflammatory disorders of vulva and perineum: Secondary | ICD-10-CM

## 2017-12-02 NOTE — Progress Notes (Signed)
   Subjective:    Patient ID: Taylor Dodson, female    DOB: [birth year], 61 y.o.   MRN: [id number]  HPI 61 yo married lady P0 here for a vulvar biopsy. I saw white area on her right labia minor at her annual exam 10/18.   Review of Systems     Objective:   Physical Exam Breathing, conversing, and ambulating normally Well nourished, well hydrated White female, no apparent distress She has some loss of pigment on her labia minora I prepped the area with Hurricane spray and then betadine.  I used about 1 cc of 1% lidocaine to numb the area. I then used a 73mm punch biopsy to obtain a specimen. Silver nitrate was used for hemostasis. She tolerated the procedure well.      Assessment & Plan:  Vulvar lesion- await pathology

## 2018-01-22 ENCOUNTER — Encounter: Payer: Managed Care, Other (non HMO) | Admitting: Family Medicine

## 2018-02-02 NOTE — Progress Notes (Signed)
HPI:   Taylor Dodson is a 62 y.o. female, who is here today for her routine physical.  Last CPE: 12/2016. Gyn physical in 09/2017. She follows with gyn periodically,  Dr Hulan Fray.  Regular exercise 3 or more time per week: Yes, walking. Following a healthy diet: Yes. She lives with her husband.  Chronic medical problems: HLD and recurrent HSV (labialis)),anxiety.  Lab Results  Component Value Date   CHOL 168 01/23/2017   HDL 55.70 01/23/2017   LDLCALC 76 01/23/2017   TRIG 181.0 (H) 01/23/2017   CHOLHDL 3 01/23/2017    Mammogram: 10/23/17 Bi-rads 1 Colonoscopy: 2014 per pt report. According to pt,her gyn also does annual FIT. Hep C screening : Not done before, she would like it added to her labs today. Last eye exam a year ago.  She has a few concerns today:  Fatigue and "sinus infection",both she has had  "forever" She would like CBC done to be sure she does not have an infection. No alleviating factors identified. Exacerbated by stress.  Intermittent nasal congestion on rhinorrhea as well as frontal pressure, mild. No associated visual changes, nausea, vomiting, focal deficit. No fever, chills, or sick contact. She has not try OTC medications.  Fatigue is chronic. In 09/2017 her gynecologist check thyroid function and he was in normal range. 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 Interrupted sleep, 4-5 hours because her husband is up all night.  Lab Results  Component Value Date   TSH 1.95 10/23/2017     Recurrent fever blisters, last visit I recommended stopping daily antiviral and trying prn treatment. She discontinued Acyclovir and since then she has had episodes q 2 weeks. She is just recovering for one. She is not sure about exacerbating factors but she has been under some stress due to health issues. When she was on daily Acyclovir 200 mg the problem was well controlled, she tolerated medication well.    Review of Systems  Constitutional:  Positive for fatigue. Negative for appetite change, chills and fever.  HENT: Positive for congestion, mouth sores, rhinorrhea and sinus pressure. Negative for dental problem, hearing loss, sore throat, trouble swallowing and voice change.   Eyes: Negative for redness and visual disturbance.  Respiratory: Negative for cough, shortness of breath and wheezing.   Cardiovascular: Negative for chest pain, palpitations and leg swelling.  Gastrointestinal: Negative for abdominal pain, nausea and vomiting.       No changes in bowel habits.  Endocrine: Negative for cold intolerance, heat intolerance, polydipsia, polyphagia and polyuria.  Genitourinary: Negative for decreased urine volume, dysuria, hematuria, vaginal bleeding and vaginal discharge.  Musculoskeletal: Negative for gait problem and myalgias.  Skin: Negative for color change and rash.  Allergic/Immunologic: Positive for environmental allergies.  Neurological: Negative for syncope, weakness and headaches.  Hematological: Negative for adenopathy. Does not bruise/bleed easily.  Psychiatric/Behavioral: Positive for sleep disturbance. Negative for confusion. The patient is nervous/anxious.   All other systems reviewed and are negative.   Current Outpatient Medications on File Prior to Visit  Medication Sig Dispense Refill  . aspirin 81 MG tablet Take 81 mg by mouth daily.     . Multiple Vitamin (MULTIVITAMIN) tablet Take 1 tablet by mouth daily.      No current facility-administered medications on file prior to visit.      Past Medical History:  Diagnosis Date  . Anxiety   . Asthma    pt states was informed had workplace asthma states since moving  to [place] has not had any difficulties  . History of bronchitis   . Hyperlipidemia     Past Surgical History:  Procedure Laterality Date  . CHOLECYSTECTOMY N/A 03/09/2016   Procedure: LAPAROSCOPIC CHOLECYSTECTOMY;  Surgeon: Coralie Keens, MD;  Location: [facility];  Service: General;   Laterality: N/A;  . neck tumor     benign  . TONSILLECTOMY      Allergies  Allergen Reactions  . Augmentin [Amoxicillin-Pot Clavulanate] Hives and Shortness Of Breath    .Marland KitchenHas patient had a PCN reaction causing immediate rash, facial/tongue/throat swelling, SOB or lightheadedness with hypotension: Yes Has patient had a PCN reaction causing severe rash involving mucus membranes or skin necrosis: No Has patient had a PCN reaction that required hospitalization No Has patient had a PCN reaction occurring within the last 10 years: No If all of the above answers are "NO", then may proceed with Cephalosporin use.   Loma Sousa [Escitalopram Oxalate] Rash    Red blotching on face and neck,rash    Family History  Problem Relation Age of Onset  . Cancer Mother        pancreatic  . Stroke Father   . Hypertension Father   . Mitral valve prolapse Father   . Hyperlipidemia Brother     Social History   Socioeconomic History  . Marital status: Married    Spouse name: None  . Number of children: None  . Years of education: None  . Highest education level: None  Social Needs  . Financial resource strain: None  . Food insecurity - worry: None  . Food insecurity - inability: None  . Transportation needs - medical: None  . Transportation needs - non-medical: None  Occupational History  . Occupation: Special educational needs teacher  Tobacco Use  . Smoking status: Never Smoker  . Smokeless tobacco: Never Used  Substance and Sexual Activity  . Alcohol use: No    Alcohol/week: 0.0 oz  . Drug use: No  . Sexual activity: Not Currently    Partners: Male  Other Topics Concern  . None  Social History Narrative  . None     Vitals:   02/03/18 0747  BP: 122/70  Pulse: 89  Resp: 12  Temp: 97.9 F (36.6 C)  SpO2: 97%   Body mass index is 24.96 kg/m.   Wt Readings from Last 3 Encounters:  02/03/18 159 lb 6 oz (72.3 kg)  12/02/17 159 lb (72.1 kg)  10/23/17 159 lb (72.1 kg)    Physical Exam    Nursing note and vitals reviewed. Constitutional: She is oriented to person, place, and time. She appears well-developed and well-nourished. No distress.  HENT:  Head: Normocephalic and atraumatic.  Right Ear: Hearing, tympanic membrane, external ear and ear canal normal.  Left Ear: Hearing, tympanic membrane, external ear and ear canal normal.  Nose: Rhinorrhea present. Right sinus exhibits no maxillary sinus tenderness and no frontal sinus tenderness. Left sinus exhibits no maxillary sinus tenderness and no frontal sinus tenderness.  Mouth/Throat: Uvula is midline, oropharynx is clear and moist and mucous membranes are normal.  Eyes: Conjunctivae and EOM are normal. Pupils are equal, round, and reactive to light.  Neck: No tracheal deviation present. No thyromegaly present.  Cardiovascular: Normal rate and regular rhythm.  No murmur heard. Pulses:      Posterior tibial pulses are 2+ on the right side, and 2+ on the left side.  Respiratory: Effort normal and breath sounds normal. No respiratory distress.  GI: Soft. She exhibits  no mass. There is no hepatomegaly. There is no tenderness.  Genitourinary:  Genitourinary Comments: Deferred to gyn.  Musculoskeletal: She exhibits no edema or tenderness.  No major deformity or signs of synovitis appreciated.  Lymphadenopathy:    She has no cervical adenopathy.       Right: No supraclavicular adenopathy present.       Left: No supraclavicular adenopathy present.  Neurological: She is alert and oriented to person, place, and time. She has normal strength. No cranial nerve deficit. Coordination and gait normal.  Reflex Scores:      Bicep reflexes are 2+ on the right side and 2+ on the left side.      Patellar reflexes are 2+ on the right side and 2+ on the left side. Skin: Skin is warm. No rash noted. No erythema.  Psychiatric: Her mood appears anxious. Cognition and memory are normal.  Well groomed, good eye contact.      ASSESSMENT AND  PLAN:  Ms. Taylor Dodson was here today for  Chief Complaint  Patient presents with  . Annual Exam    Orders Placed This Encounter  Procedures  . Tdap vaccine greater than or equal to 7yo IM  . Lipid panel  . CBC with Differential  . Basic metabolic panel  . Hepatitis C antibody screen  . VITAMIN D 25 Hydroxy (Vit-D Deficiency, Fractures)   Lab Results  Component Value Date   CHOL 170 02/03/2018   HDL 77.70 02/03/2018   LDLCALC 76 02/03/2018   TRIG 84.0 02/03/2018   CHOLHDL 2 02/03/2018   Lab Results  Component Value Date   WBC 7.0 02/03/2018   HGB 15.3 (H) 02/03/2018   HCT 44.9 02/03/2018   MCV 97.7 02/03/2018   PLT 386.0 02/03/2018   Lab Results  Component Value Date   CREATININE 0.70 02/03/2018   BUN 15 02/03/2018   NA 144 02/03/2018   K 5.1 02/03/2018   CL 103 02/03/2018   CO2 30 02/03/2018    Routine general medical examination at a health care facility  We discussed the importance of regular physical activity and healthy diet for prevention of chronic illness and/or complications. Preventive guidelines reviewed. Vaccination updated. Aspirin for primary prevention discussed. Tolerated it well, so she will continue. Ca++ and vit D supplementation recommended. Next CPE in a year.  The 10-year ASCVD risk score Mikey Bussing DC Brooke Bonito., et al., 2013) is: 2.1%   Values used to calculate the score:     Age: 78 years     Sex: Female     Is Non-Hispanic African American: No     Diabetic: No     Tobacco smoker: No     Systolic Blood Pressure: 161 mmHg     Is BP treated: No     HDL Cholesterol: 77.7 mg/dL     Total Cholesterol: 170 mg/dL  - Zoster Vaccine Adjuvanted (SHINGRIX) injection; 0.5 ml in muscle and repeat in 8 weeks  Dispense: 0.5 mL; Refill: 1  Recurrent herpes labialis Not well controlled. Resume Acyclovir 200 mg daily. F/U in 12 months,before if needed.    Hyperlipidemia No changes in current management, will follow labs done today and will  give further recommendations accordingly. F/U in 6-12 months.  Fatigue We discussed possible etiologies: Systemic illness, immunologic,endocrinology,sleep disorder, psychiatric/psychologic, infectious,medications side effects, and idiopathic. Examination today does not suggest a serious process. In her case I think stress and lack of sleep may be contributing to problem.   Further recommendations will be  given according to lab results.   Allergic rhinitis Intranasal steroid spray may help, Flonase nasal spray. Nasal irrigations. OTC antihistaminic. F/U as needed.   Diabetes mellitus screening - Basic metabolic panel  Encounter for HCV screening test for high risk patient - Hepatitis C antibody screen  Need for Tdap vaccination - Tdap vaccine greater than or equal to 7yo IM     Return in 1 year (on 02/03/2019) for routine and f/u.      Betty G. Martinique, MD  [facility]. [place] office.

## 2018-02-03 ENCOUNTER — Encounter: Payer: Self-pay | Admitting: Family Medicine

## 2018-02-03 ENCOUNTER — Ambulatory Visit (INDEPENDENT_AMBULATORY_CARE_PROVIDER_SITE_OTHER): Payer: Managed Care, Other (non HMO) | Admitting: Family Medicine

## 2018-02-03 VITALS — BP 122/70 | HR 89 | Temp 97.9°F | Resp 12 | Ht 67.0 in | Wt 159.4 lb

## 2018-02-03 DIAGNOSIS — J309 Allergic rhinitis, unspecified: Secondary | ICD-10-CM | POA: Diagnosis not present

## 2018-02-03 DIAGNOSIS — Z131 Encounter for screening for diabetes mellitus: Secondary | ICD-10-CM

## 2018-02-03 DIAGNOSIS — R5383 Other fatigue: Secondary | ICD-10-CM | POA: Insufficient documentation

## 2018-02-03 DIAGNOSIS — B001 Herpesviral vesicular dermatitis: Secondary | ICD-10-CM | POA: Diagnosis not present

## 2018-02-03 DIAGNOSIS — Z9189 Other specified personal risk factors, not elsewhere classified: Secondary | ICD-10-CM

## 2018-02-03 DIAGNOSIS — Z23 Encounter for immunization: Secondary | ICD-10-CM | POA: Diagnosis not present

## 2018-02-03 DIAGNOSIS — Z0000 Encounter for general adult medical examination without abnormal findings: Secondary | ICD-10-CM | POA: Diagnosis not present

## 2018-02-03 DIAGNOSIS — Z1159 Encounter for screening for other viral diseases: Secondary | ICD-10-CM

## 2018-02-03 DIAGNOSIS — R5382 Chronic fatigue, unspecified: Secondary | ICD-10-CM

## 2018-02-03 DIAGNOSIS — E785 Hyperlipidemia, unspecified: Secondary | ICD-10-CM

## 2018-02-03 LAB — CBC WITH DIFFERENTIAL/PLATELET
BASOS PCT: 2.1 % (ref 0.0–3.0)
Basophils Absolute: 0.2 10*3/uL — ABNORMAL HIGH (ref 0.0–0.1)
EOS PCT: 1.6 % (ref 0.0–5.0)
Eosinophils Absolute: 0.1 10*3/uL (ref 0.0–0.7)
HCT: 44.9 % (ref 36.0–46.0)
Hemoglobin: 15.3 g/dL — ABNORMAL HIGH (ref 12.0–15.0)
LYMPHS ABS: 2.4 10*3/uL (ref 0.7–4.0)
Lymphocytes Relative: 34.7 % (ref 12.0–46.0)
MCHC: 34.1 g/dL (ref 30.0–36.0)
MCV: 97.7 fl (ref 78.0–100.0)
MONO ABS: 0.6 10*3/uL (ref 0.1–1.0)
MONOS PCT: 8.4 % (ref 3.0–12.0)
NEUTROS ABS: 3.7 10*3/uL (ref 1.4–7.7)
Neutrophils Relative %: 53.2 % (ref 43.0–77.0)
PLATELETS: 386 10*3/uL (ref 150.0–400.0)
RBC: 4.6 Mil/uL (ref 3.87–5.11)
RDW: 12.7 % (ref 11.5–15.5)
WBC: 7 10*3/uL (ref 4.0–10.5)

## 2018-02-03 LAB — BASIC METABOLIC PANEL
BUN: 15 mg/dL (ref 6–23)
CALCIUM: 10.1 mg/dL (ref 8.4–10.5)
CO2: 30 mEq/L (ref 19–32)
CREATININE: 0.7 mg/dL (ref 0.40–1.20)
Chloride: 103 mEq/L (ref 96–112)
GFR: 90.43 mL/min (ref >60.00)
Glucose, Bld: 105 mg/dL — ABNORMAL HIGH (ref 70–99)
Potassium: 5.1 mEq/L (ref 3.5–5.1)
Sodium: 144 mEq/L (ref 135–145)

## 2018-02-03 LAB — LIPID PANEL
Cholesterol: 170 mg/dL (ref 0–200)
HDL: 77.7 mg/dL (ref >39.00)
LDL Cholesterol: 76 mg/dL (ref 0–99)
NonHDL: 92.79
TRIGLYCERIDES: 84 mg/dL (ref 0.0–149.0)
Total CHOL/HDL Ratio: 2
VLDL: 16.8 mg/dL (ref 0.0–40.0)

## 2018-02-03 LAB — VITAMIN D 25 HYDROXY (VIT D DEFICIENCY, FRACTURES): VITD: 34.98 ng/mL (ref 30.00–100.00)

## 2018-02-03 MED ORDER — PRAVASTATIN SODIUM 40 MG PO TABS: 60.0000 mg | ORAL_TABLET | Freq: Every day | ORAL | 2 refills | Status: DC

## 2018-02-03 MED ORDER — ACYCLOVIR 400 MG PO TABS: 200.0000 mg | ORAL_TABLET | Freq: Every day | ORAL | 3 refills | Status: DC

## 2018-02-03 MED ORDER — ZOSTER VAC RECOMB ADJUVANTED 50 MCG/0.5ML IM SUSR: INTRAMUSCULAR | 1 refills | Status: DC

## 2018-02-03 NOTE — Assessment & Plan Note (Signed)
No changes in current management, will follow labs done today and will give further recommendations accordingly. F/U in 6-12 months.  

## 2018-02-03 NOTE — Assessment & Plan Note (Signed)
Not well controlled. Resume Acyclovir 200 mg daily. F/U in 12 months,before if needed.

## 2018-02-03 NOTE — Assessment & Plan Note (Signed)
Intranasal steroid spray may help, Flonase nasal spray. Nasal irrigations. OTC antihistaminic. F/U as needed.

## 2018-02-03 NOTE — Patient Instructions (Signed)
A few things to remember from today's visit:   Routine general medical examination at a health care facility - Plan: Zoster Vaccine Adjuvanted [facility]) injection  Hyperlipidemia, unspecified hyperlipidemia type - Plan: Lipid panel  Chronic fatigue - Plan: CBC with Differential, VITAMIN D 25 Hydroxy (Vit-D Deficiency, Fractures)  Recurrent herpes labialis - Plan: acyclovir (ZOVIRAX) 400 MG tablet  Diabetes mellitus screening - Plan: Basic metabolic panel  Encounter for HCV screening test for high risk patient - Plan: Hepatitis C antibody screen  Today you have you routine preventive visit and we also discussed above problems.   At least 150 minutes of moderate exercise per week, daily brisk walking for 15-30 min is a good exercise option. Healthy diet low in saturated (animal) fats and sweets and consisting of fresh fruits and vegetables, lean meats such as fish and white chicken and whole grains.  These are some of recommendations for screening depending of age and risk factors:  It is a common symptom associated with multiple factors: psychologic,medications, systemic illness, sleep disorders,infections, and unknown causes. Some work-up can be done to evaluate for common causes as thyroid disease,anemia,diabetes, or abnormalities in calcium,potassium,or sodium. Regular physical activity as tolerated and a healthy diet is usually might help and usually recommended for chronic fatigue.  - Vaccines:  Tdap vaccine every 10 years.Given today.  Shingles vaccine recommended at age 48, could be given after 61 years of age but not sure about insurance coverage. Prescription given.  Pneumonia vaccines:  Prevnar 13 at 65 and Pneumovax at 23. Sometimes Pneumovax is giving earlier if history of smoking, lung disease,diabetes,kidney disease among some.    Screening for diabetes at age 67 and every 3 years.  Cervical cancer prevention:  Pap smear starts at 61 years of age and continues  periodically until 61 years old in low risk women. Pap smear every 3 years between 18 and 62 years old. Pap smear every 3-5 years between women 51 and older if pap smear negative and HPV screening negative.   -Breast cancer: Mammogram: There is disagreement between experts about when to start screening in low risk asymptomatic female but recent recommendations are to start screening at 92 and not later than 61 years old , every 1-2 years and after 61 yo q 2 years. Screening is recommended until 61 years old but some women can continue screening depending of healthy issues.   Colon cancer screening: starts at 61 years old until 61 years old.  Cholesterol disorder screening at age 30 and every 3 years.  Also recommended:  1. Dental visit- Brush and floss your teeth twice daily; visit your dentist twice a year. 2. Eye doctor- Get an eye exam at least every 2 years. 3. Helmet use- Always wear a helmet when riding a bicycle, motorcycle, rollerblading or skateboarding. 4. Safe sex- If you may be exposed to sexually transmitted infections, use a condom. 5. Seat belts- Seat belts can save your live; always wear one. 6. Smoke/Carbon Monoxide detectors- These detectors need to be installed on the appropriate level of your home. Replace batteries at least once a year. 7. Skin cancer- When out in the sun please cover up and use sunscreen 15 SPF or higher. 8. Violence- If anyone is threatening or hurting you, please tell your healthcare provider.  9. Drink alcohol in moderation- Limit alcohol intake to one drink or less per day. Never drink and drive.   Please be sure medication list is accurate. If a new problem present, please set up  appointment sooner than planned today.

## 2018-02-03 NOTE — Assessment & Plan Note (Signed)
We discussed possible etiologies: Systemic illness, immunologic,endocrinology,sleep disorder, psychiatric/psychologic, infectious,medications side effects, and idiopathic. Examination today does not suggest a serious process. In her case I think stress and lack of sleep may be contributing to problem.   Further recommendations will be given according to lab results.

## 2018-02-04 ENCOUNTER — Encounter: Payer: Self-pay | Admitting: Family Medicine

## 2018-02-04 LAB — HEPATITIS C ANTIBODY
HEP C AB: NONREACTIVE
SIGNAL TO CUT-OFF: 0.47 (ref <1.00)

## 2018-02-19 ENCOUNTER — Telehealth: Payer: Self-pay | Admitting: *Deleted

## 2018-02-19 NOTE — Telephone Encounter (Signed)
Copied from [place] [id number]. Topic: Bill or Statement - Patient/Guarantor Inquiry >> Feb 19, 2018 11:41 AM Margot Ables wrote: Pt called stating her insurance and billing advised her to contact MD office - visit 2018/02/20 was coded for outpt visit and preventative visit. Pt states it was supposed to be billed as one or the other. Please advise.

## 2018-02-19 NOTE — Telephone Encounter (Signed)
Hello,  Documentation supports billing both preventive and problems oriented visit. Thanks, [employer]

## 2018-02-19 NOTE — Telephone Encounter (Signed)
Can you please review billing concern below?  It looks like patient was charged for an additional office visit due to c/o fatigue and sinus infection. Is this accurate?

## 2018-02-25 NOTE — Telephone Encounter (Signed)
Pt notified of results/instructions and verbalized understanding by Norlene Duel, Practice Administrator.

## 2018-11-24 ENCOUNTER — Encounter: Payer: Self-pay | Admitting: Physician Assistant

## 2018-11-24 ENCOUNTER — Ambulatory Visit (INDEPENDENT_AMBULATORY_CARE_PROVIDER_SITE_OTHER): Payer: Managed Care, Other (non HMO) | Admitting: Physician Assistant

## 2018-11-24 VITALS — BP 138/74 | HR 92 | Temp 97.6°F | Ht 66.0 in | Wt 158.0 lb

## 2018-11-24 DIAGNOSIS — Z5181 Encounter for therapeutic drug level monitoring: Secondary | ICD-10-CM | POA: Diagnosis not present

## 2018-11-24 DIAGNOSIS — E782 Mixed hyperlipidemia: Secondary | ICD-10-CM

## 2018-11-24 DIAGNOSIS — Z1231 Encounter for screening mammogram for malignant neoplasm of breast: Secondary | ICD-10-CM

## 2018-11-24 DIAGNOSIS — Z7689 Persons encountering health services in other specified circumstances: Secondary | ICD-10-CM | POA: Diagnosis not present

## 2018-11-24 DIAGNOSIS — Z131 Encounter for screening for diabetes mellitus: Secondary | ICD-10-CM

## 2018-11-24 DIAGNOSIS — Z130 Encounter for screening for diseases of the blood and blood-forming organs and certain disorders involving the immune mechanism: Secondary | ICD-10-CM

## 2018-11-24 MED ORDER — PRAVASTATIN SODIUM 40 MG PO TABS: 40.0000 mg | ORAL_TABLET | Freq: Every day | ORAL | 3 refills | Status: AC

## 2018-11-24 NOTE — Patient Instructions (Addendum)
Return in approx 3 months for fasting labs. The lab is a walk-in open M-F 7:30a-4:30p (closed 12:30-1:30p). Nothing to eat or drink after midnight or at least 8 hours before your blood draw. You can have water and your medications.    Fat and Cholesterol Restricted Diet High levels of fat and cholesterol in your blood may lead to various health problems, such as diseases of the heart, blood vessels, gallbladder, liver, and pancreas. Fats are concentrated sources of energy that come in various forms. Certain types of fat, including saturated fat, may be harmful in excess. Cholesterol is a substance needed by your body in small amounts. Your body makes all the cholesterol it needs. Excess cholesterol comes from the food you eat. When you have high levels of cholesterol and saturated fat in your blood, health problems can develop because the excess fat and cholesterol will gather along the walls of your blood vessels, causing them to narrow. Choosing the right foods will help you control your intake of fat and cholesterol. This will help keep the levels of these substances in your blood within normal limits and reduce your risk of disease. What is my plan? Your health care provider recommends that you:  Limit your fat intake to ______% or less of your total calories per day.  Limit the amount of cholesterol in your diet to less than _________mg per day.  Eat 20-30 grams of fiber each day.  What types of fat should I choose?  Choose healthy fats more often. Choose monounsaturated and polyunsaturated fats, such as olive and canola oil, flaxseeds, walnuts, almonds, and seeds.  Eat more omega-3 fats. Good choices include salmon, mackerel, sardines, tuna, flaxseed oil, and ground flaxseeds. Aim to eat fish at least two times a week.  Limit saturated fats. Saturated fats are primarily found in animal products, such as meats, butter, and cream. Plant sources of saturated fats include palm oil, palm kernel  oil, and coconut oil.  Avoid foods with partially hydrogenated oils in them. These contain trans fats. Examples of foods that contain trans fats are stick margarine, some tub margarines, cookies, crackers, and other baked goods. What general guidelines do I need to follow? These guidelines for healthy eating will help you control your intake of fat and cholesterol:  Check food labels carefully to identify foods with trans fats or high amounts of saturated fat.  Fill one half of your plate with vegetables and green salads.  Fill one fourth of your plate with whole grains. Look for the word "whole" as the first word in the ingredient list.  Fill one fourth of your plate with lean protein foods.  Limit fruit to two servings a day. Choose fruit instead of juice.  Eat more foods that contain fiber, such as apples, broccoli, carrots, beans, peas, and barley.  Eat more home-cooked food and less restaurant, buffet, and fast food.  Limit or avoid alcohol.  Limit foods high in starch and sugar.  Limit fried foods.  Cook foods using methods other than frying. Baking, boiling, grilling, and broiling are all great options.  Lose weight if you are overweight. Losing just 5-10% of your initial body weight can help your overall health and prevent diseases such as diabetes and heart disease.  What foods can I eat? Grains  Whole grains, such as whole wheat or whole grain breads, crackers, cereals, and pasta. Unsweetened oatmeal, bulgur, barley, quinoa, or brown rice. Corn or whole wheat flour tortillas. Vegetables  Fresh or frozen vegetables (  raw, steamed, roasted, or grilled). Green salads. Fruits  All fresh, canned (in natural juice), or frozen fruits. Meats and other protein foods  Ground beef (85% or leaner), grass-fed beef, or beef trimmed of fat. Skinless chicken or [place]. Ground chicken or [place]. Pork trimmed of fat. All fish and seafood. Eggs. Dried beans, peas, or lentils. Unsalted  nuts or seeds. Unsalted canned or dry beans. Dairy  Low-fat dairy products, such as skim or 1% milk, 2% or reduced-fat cheeses, low-fat ricotta or cottage cheese, or plain low-fat yo Fats and oils  Tub margarines without trans fats. Light or reduced-fat mayonnaise and salad dressings. Avocado. Olive, canola, sesame, or safflower oils. Natural peanut or almond butter (choose ones without added sugar and oil). The items listed above may not be a complete list of recommended foods or beverages. Contact your dietitian for more options. Foods to avoid Grains  White bread. White pasta. White rice. Cornbread. Bagels, pastries, and croissants. Crackers that contain trans fat. Vegetables  White potatoes. Corn. Creamed or fried vegetables. Vegetables in a cheese sauce. Fruits  Dried fruits. Canned fruit in light or heavy syrup. Fruit juice. Meats and other protein foods  Fatty cuts of meat. Ribs, chicken wings, bacon, sausage, bologna, salami, chitterlings, fatback, hot dogs, bratwurst, and packaged luncheon meats. Liver and organ meats. Dairy  Whole or 2% milk, cream, half-and-half, and cream cheese. Whole milk cheeses. Whole-fat or sweetened yogurt. Full-fat cheeses. Nondairy creamers and whipped toppings. Processed cheese, cheese spreads, or cheese curds. Beverages  Alcohol. Sweetened drinks (such as sodas, lemonade, and fruit drinks or punches). Fats and oils  Butter, stick margarine, lard, shortening, ghee, or bacon fat. Coconut, palm kernel, or palm oils. Sweets and desserts  Corn syrup, sugars, honey, and molasses. Candy. Jam and jelly. Syrup. Sweetened cereals. Cookies, pies, cakes, donuts, muffins, and ice cream. The items listed above may not be a complete list of foods and beverages to avoid. Contact your dietitian for more information. This information is not intended to replace advice given to you by your health care provider. Make sure you discuss any questions you have with  your health care provider. Document Released: 12/17/2005 Document Revised: 01/07/2015 Document Reviewed: 03/17/2014 Elsevier Interactive Patient Education  [employer].

## 2018-11-24 NOTE — Progress Notes (Signed)
HPI:                                                                Taylor Dodson is a 62 y.o. female who presents to [facility] today to establish care  Current concerns:  HLD: currently taking Pravastatin 60 mg daily. She would like to see if she can decrease her dose because she does not like cutting an additional tablet in half. Denies myalgias.    Depression screen [facility] 2/9 11/24/2018 02/03/2018  Decreased Interest 0 0  Down, Depressed, Hopeless 0 0  PHQ - 2 Score 0 0    GAD 7 : Generalized Anxiety Score 11/24/2018  Nervous, Anxious, on Edge 0  Control/stop worrying 0  Worry too much - different things 0  Trouble relaxing 0  Restless 0  Easily annoyed or irritable 0  Afraid - awful might happen 0  Total GAD 7 Score 0  Anxiety Difficulty Not difficult at all      Past Medical History:  Diagnosis Date  . Anxiety   . Asthma    pt states was informed had workplace asthma states since moving to [place] has not had any difficulties  . Calculus of gallbladder with acute cholecystitis 03/02/2016   Dr. Ninfa Linden with [facility] March 2017   . 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 History of bronchitis   . Hyperlipidemia    Past Surgical History:  Procedure Laterality Date  . CHOLECYSTECTOMY N/A 03/09/2016   Procedure: LAPAROSCOPIC CHOLECYSTECTOMY;  Surgeon: Coralie Keens, MD;  Location: [facility];  Service: General;  Laterality: N/A;  . neck tumor     benign  . TONSILLECTOMY     Social History   Tobacco Use  . Smoking status: Never Smoker  . Smokeless tobacco: Never Used  Substance Use Topics  . Alcohol use: No    Alcohol/week: 0.0 standard drinks   family history includes Cancer in her mother; Hyperlipidemia in her brother; Hypertension in her father; Mitral valve prolapse in her father; Pancreatic cancer in her mother; Stroke in her father; Thyroid disease in her sister.    ROS: negative except  as noted in the HPI  Medications: Current Outpatient Medications  Medication Sig Dispense Refill  . acyclovir (ZOVIRAX) 400 MG tablet Take 0.5 tablets (200 mg total) by mouth daily. 45 tablet 3  . aspirin 81 MG tablet Take 81 mg by mouth daily.     . Multiple Vitamin (MULTIVITAMIN) tablet Take 1 tablet by mouth daily.     . pravastatin (PRAVACHOL) 40 MG tablet Take 1 tablet (40 mg total) by mouth at bedtime. 90 tablet 3   No current facility-administered medications for this visit.    Allergies  Allergen Reactions  . Augmentin [Amoxicillin-Pot Clavulanate] Hives and Shortness Of Breath    .Marland KitchenHas patient had a PCN reaction causing immediate rash, facial/tongue/throat swelling, SOB or lightheadedness with hypotension: Yes Has patient had a PCN reaction causing severe rash involving mucus membranes or skin necrosis: No Has patient had a PCN reaction that required hospitalization No Has patient had a PCN reaction occurring within the last 10 years: No If all of the above answers are "NO", then may proceed with Cephalosporin use.   Marland Kitchen  Lexapro [Escitalopram Oxalate] Rash    Red blotching on face and neck,rash       Objective:  BP 138/74   Pulse 92   Temp 97.6 F (36.4 C) (Oral)   Ht 5\' 6"  (1.676 m)   Wt 158 lb (71.7 kg)   BMI 25.50 kg/m  Gen:  alert, not ill-appearing, no distress, appropriate for age 24: head normocephalic without obvious abnormality, conjunctiva and cornea clear, trachea midline Pulm: Normal work of breathing, normal phonation, clear to auscultation bilaterally, no wheezes, rales or rhonchi CV: Normal rate, regular rhythm, s1 and s2 distinct, no murmurs, clicks or rubs  Neuro: alert and oriented x 3, no tremor MSK: extremities atraumatic, normal gait and station Skin: intact, no rashes on exposed skin, no jaundice, no cyanosis Psych: well-groomed, cooperative, good eye contact, euthymic mood, affect mood-congruent, speech is articulate, and thought processes  clear and goal-directed  Lab Results  Component Value Date   CREATININE 0.70 02/03/2018   BUN 15 02/03/2018   NA 144 02/03/2018   K 5.1 02/03/2018   CL 103 02/03/2018   CO2 30 02/03/2018   Lab Results  Component Value Date   ALT 20 01/23/2017   AST 19 01/23/2017   ALKPHOS 74 01/23/2017   BILITOT 0.3 01/23/2017   Lab Results  Component Value Date   WBC 7.0 02/03/2018   HGB 15.3 (H) 02/03/2018   HCT 44.9 02/03/2018   MCV 97.7 02/03/2018   PLT 386.0 02/03/2018   Lab Results  Component Value Date   CHOL 170 02/03/2018   HDL 77.70 02/03/2018   LDLCALC 76 02/03/2018   TRIG 84.0 02/03/2018   CHOLHDL 2 02/03/2018     No results found for this or any previous visit (from the past 72 hour(s)). No results found.    Assessment and Plan: 62 y.o. female with   .Taylor Dodson was seen today for establish care.  Diagnoses and all orders for this visit:  Encounter to establish care  Breast cancer screening by mammogram -     MM 3D SCREEN BREAST BILATERAL; Future  Mixed hyperlipidemia -     pravastatin (PRAVACHOL) 40 MG tablet; Take 1 tablet (40 mg total) by mouth at bedtime. -     Lipid Panel w/reflex Direct LDL  Medication monitoring encounter -     CBC -     COMPLETE METABOLIC PANEL WITH GFR -     Lipid Panel w/reflex Direct LDL  Screening for blood disease -     CBC -     COMPLETE METABOLIC PANEL WITH GFR  Screening for diabetes mellitus -     COMPLETE METABOLIC PANEL WITH GFR   - Personally reviewed PMH, PSH, PFH, medications, allergies, HM - Age-appropriate cancer screening: due for mammogram, order placed; Pap UTD, colonoscopy UTD per patient, requesting records - Influenza declined - Tdap UTD - PHQ2 negative - She is not due for fasting labs until Feb 2020. Her LDL is excellent at 76, so will reduce her Pravastatin to 40 mg QHS and recheck in 3 months     Patient education and anticipatory guidance given Patient agrees with treatment plan Follow-up in 3  months for annual physical exam or sooner as needed if symptoms worsen or fail to improve  Darlyne Russian PA-C

## 2018-11-29 ENCOUNTER — Emergency Department
Admission: EM | Admit: 2018-11-29 | Discharge: 2018-11-29 | Disposition: A | Discharge status: Home / Self Care | Payer: Managed Care, Other (non HMO) | Source: Home / Self Care

## 2018-12-13 DIAGNOSIS — S42201D Unspecified fracture of upper end of right humerus, subsequent encounter for fracture with routine healing: Secondary | ICD-10-CM | POA: Insufficient documentation

## 2019-02-18 ENCOUNTER — Ambulatory Visit: Payer: Managed Care, Other (non HMO)

## 2019-02-23 ENCOUNTER — Ambulatory Visit (INDEPENDENT_AMBULATORY_CARE_PROVIDER_SITE_OTHER): Payer: Managed Care, Other (non HMO) | Admitting: Obstetrics & Gynecology

## 2019-02-23 ENCOUNTER — Encounter: Payer: Self-pay | Admitting: Obstetrics & Gynecology

## 2019-02-23 VITALS — BP 122/73 | HR 91 | Ht 67.0 in | Wt 149.0 lb

## 2019-02-23 DIAGNOSIS — Z1151 Encounter for screening for human papillomavirus (HPV): Secondary | ICD-10-CM

## 2019-02-23 DIAGNOSIS — N904 Leukoplakia of vulva: Secondary | ICD-10-CM

## 2019-02-23 DIAGNOSIS — Z124 Encounter for screening for malignant neoplasm of cervix: Secondary | ICD-10-CM | POA: Diagnosis not present

## 2019-02-23 DIAGNOSIS — Z01419 Encounter for gynecological examination (general) (routine) without abnormal findings: Secondary | ICD-10-CM | POA: Diagnosis not present

## 2019-02-23 DIAGNOSIS — A5901 Trichomonal vulvovaginitis: Secondary | ICD-10-CM

## 2019-02-23 HISTORY — DX: Trichomonal vulvovaginitis: A59.01

## 2019-02-23 MED ORDER — CLOBETASOL PROPIONATE 0.05 % EX OINT: TOPICAL_OINTMENT | CUTANEOUS | 5 refills | Status: AC

## 2019-02-23 NOTE — Progress Notes (Signed)
Subjective:    Taylor Dodson is a 64 y.o. separated P0 female who presents for an annual exam. The patient has no complaints today. The patient is sexually active. GYN screening history: last pap: was normal. The patient wears seatbelts: yes. The patient participates in regular exercise: yes. Has the patient ever been transfused or tattooed?: no. The patient reports that there is not domestic violence in her life.   Menstrual History: OB History    Gravida  0   Para  0   Term  0   Preterm  0   AB  0   Living  0     SAB  0   TAB  0   Ectopic  0   Multiple  0   Live Births              Menarche age: 63 No LMP recorded. Patient is postmenopausal.    The following portions of the patient's history were reviewed and updated as appropriate: allergies, current medications, past family history, past medical history, past social history, past surgical history and problem list.  Review of Systems Pertinent items are noted in HPI.   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[place])    Objective:    BP 122/73   Pulse 91   Ht 5\' 7"  (1.702 m)   Wt 149 lb (67.6 kg)   BMI 23.34 kg/m   General Appearance:    Alert, cooperative, no distress, appears stated age  Head:    Normocephalic, without obvious abnormality, atraumatic  Eyes:    PERRL, conjunctiva/corneas clear, EOM's intact, fundi    benign, both eyes  Ears:    Normal TM's and external ear canals, both ears  Nose:   Nares normal, septum midline, mucosa normal, no drainage    or sinus tenderness  Throat:   Lips, mucosa, and tongue normal; teeth and gums normal  Neck:   Supple, symmetrical, trachea midline, no adenopathy;    thyroid:  no enlargement/tenderness/nodules; no carotid   bruit or JVD  Back:     Symmetric, no curvature, ROM normal, no CVA tenderness  Lungs:     Clear to auscultation bilaterally, respirations unlabored  Chest Wall:    No tenderness or deformity    Heart:    Regular rate and rhythm, S1 and S2 normal, no murmur, rub   or gallop  Breast Exam:    No tenderness, masses, or nipple abnormality  Abdomen:     Soft, non-tender, bowel sounds active all four quadrants,    no masses, no organomegaly  Genitalia:    Normal female without lesion, discharge or tenderness, normal size and shape, anteverted, mobile, non-tender, normal adnexal exam      Extremities:   Extremities normal, atraumatic, no cyanosis or edema  Pulses:   2+ and symmetric all extremities  Skin:   Skin color, texture, turgor normal, no rashes or lesions  Lymph nodes:   Cervical, supraclavicular, and axillary nodes normal  Neurologic:   CNII-XII intact, normal strength, sensation and reflexes    throughout  .    Assessment:    Healthy female exam.   Lichen sclerosis   Plan:     Thin prep Pap smear. with cotesting STI testing Temovate prescribed Declines colonoscopy, may consider Cologard

## 2019-02-23 NOTE — Progress Notes (Signed)
Last pap 10/23/17- negative

## 2019-02-24 ENCOUNTER — Encounter: Payer: Self-pay | Admitting: Physician Assistant

## 2019-02-24 ENCOUNTER — Ambulatory Visit (INDEPENDENT_AMBULATORY_CARE_PROVIDER_SITE_OTHER): Payer: Managed Care, Other (non HMO) | Admitting: Physician Assistant

## 2019-02-24 ENCOUNTER — Ambulatory Visit (INDEPENDENT_AMBULATORY_CARE_PROVIDER_SITE_OTHER): Payer: Managed Care, Other (non HMO)

## 2019-02-24 VITALS — BP 129/78 | HR 91 | Temp 97.7°F | Wt 149.0 lb

## 2019-02-24 DIAGNOSIS — Z131 Encounter for screening for diabetes mellitus: Secondary | ICD-10-CM

## 2019-02-24 DIAGNOSIS — Z5181 Encounter for therapeutic drug level monitoring: Secondary | ICD-10-CM

## 2019-02-24 DIAGNOSIS — E782 Mixed hyperlipidemia: Secondary | ICD-10-CM | POA: Diagnosis not present

## 2019-02-24 DIAGNOSIS — R590 Localized enlarged lymph nodes: Secondary | ICD-10-CM

## 2019-02-24 DIAGNOSIS — E039 Hypothyroidism, unspecified: Secondary | ICD-10-CM

## 2019-02-24 LAB — HEPATITIS C ANTIBODY
HEP C AB: NONREACTIVE
SIGNAL TO CUT-OFF: 0.05 (ref <1.00)

## 2019-02-24 LAB — HEPATITIS B SURFACE ANTIGEN: Hepatitis B Surface Ag: NONREACTIVE

## 2019-02-24 LAB — RPR: RPR: NONREACTIVE

## 2019-02-24 LAB — HIV ANTIBODY (ROUTINE TESTING W REFLEX): HIV 1&2 Ab, 4th Generation: NONREACTIVE

## 2019-02-24 MED ORDER — AZITHROMYCIN 250 MG PO TABS: ORAL_TABLET | ORAL | 0 refills | Status: DC

## 2019-02-24 NOTE — Progress Notes (Signed)
HPI:                                                                Taylor Dodson is a 63 y.o. female who presents to [facility] [place] today for swollen lymph nodes  For over a week has noted left sided, painful neck swelling just below her jaw that she describes as a swollen lymph node. Has felt more fatigued and had a daily persistent headache. No fever, neck stiffness, sore throat, dysphagia, dental pain, ear pain. Headache is bilateral frontal, rated as mild, described as throbbing, no associated photophobia, phonophobia, nausea or dizziness. Does not feel like a migraine according to her.  Also due for routine labs today.   Past Medical History:  Diagnosis Date  . Anxiety   . Asthma    pt states was informed had workplace asthma states since moving to [place] has not had any difficulties  . Calculus of gallbladder with acute cholecystitis 03/02/2016   Dr. Ninfa Linden with [facility] March 2017   . 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 History of bronchitis   . Hyperlipidemia    Past Surgical History:  Procedure Laterality Date  . CHOLECYSTECTOMY N/A 03/09/2016   Procedure: LAPAROSCOPIC CHOLECYSTECTOMY;  Surgeon: Coralie Keens, MD;  Location: [facility];  Service: General;  Laterality: N/A;  . neck tumor     benign  . TONSILLECTOMY     Social History   Tobacco Use  . Smoking status: Never Smoker  . Smokeless tobacco: Never Used  Substance Use Topics  . Alcohol use: No    Alcohol/week: 0.0 standard drinks   family history includes Cancer in her mother; Hyperlipidemia in her brother; Hypertension in her father; Mitral valve prolapse in her father; Pancreatic cancer in her mother; Stroke in her father; Thyroid disease in her sister.    ROS: negative except as noted in the HPI  Medications: Current Outpatient Medications  Medication Sig Dispense Refill  . acyclovir (ZOVIRAX) 400 MG tablet Take 0.5 tablets (200 mg  total) by mouth daily. 45 tablet 3  . aspirin 81 MG tablet Take 81 mg by mouth daily.     Marland Kitchen azithromycin (ZITHROMAX Z-PAK) 250 MG tablet Take 2 tablets (500 mg) on  Day 1,  followed by 1 tablet (250 mg) once daily on Days 2 through 5. 6 tablet 0  . clobetasol ointment (TEMOVATE) 0.05 % Apply to affected area every night for 4 weeks, then every other day for 4 weeks and then twice a week for 4 weeks or until resolution. 30 g 5  . Multiple Vitamin (MULTIVITAMIN) tablet Take 1 tablet by mouth daily.     . pravastatin (PRAVACHOL) 40 MG tablet Take 1 tablet (40 mg total) by mouth at bedtime. 90 tablet 3   No current facility-administered medications for this visit.    Allergies  Allergen Reactions  . Augmentin [Amoxicillin-Pot Clavulanate] Hives and Shortness Of Breath    .Marland KitchenHas patient had a PCN reaction causing immediate rash, facial/tongue/throat swelling, SOB or lightheadedness with hypotension: Yes Has patient had a PCN reaction causing severe rash involving mucus membranes or skin necrosis: No Has patient had a PCN reaction that required hospitalization No Has patient had a PCN  reaction occurring within the last 10 years: No If all of the above answers are "NO", then may proceed with Cephalosporin use.   Loma Sousa [Escitalopram Oxalate] Rash    Red blotching on face and neck,rash       Objective:  BP 129/78   Pulse 91   Temp 97.7 F (36.5 C) (Oral)   Wt 149 lb (67.6 kg)   BMI 23.34 kg/m  Physical Exam Constitutional:      Appearance: Normal appearance. She is normal weight. She is not ill-appearing.  HENT:     Right Ear: Hearing and tympanic membrane normal.     Left Ear: Hearing and tympanic membrane normal.     Mouth/Throat:     Lips: Pink.     Mouth: Mucous membranes are moist.     Dentition: Normal dentition.     Tongue: No lesions.     Pharynx: Oropharynx is clear. Uvula midline.     Tonsils: No tonsillar exudate.  Neck:     Musculoskeletal: Full passive range of  motion without pain, normal range of motion and neck supple. No neck rigidity.     Thyroid: No thyroid mass, thyromegaly or thyroid tenderness.     Trachea: Trachea and phonation normal.  Cardiovascular:     Rate and Rhythm: Normal rate and regular rhythm.     Heart sounds: Normal heart sounds. No murmur.  Pulmonary:     Effort: Pulmonary effort is normal.     Breath sounds: Normal breath sounds and air entry. No wheezing, rhonchi or rales.  Lymphadenopathy:     Head:     Right side of head: No submental, submandibular, tonsillar, preauricular, posterior auricular or occipital adenopathy.     Left side of head: Submandibular (visibly swollen and exquistely tender) adenopathy present. No submental, preauricular, posterior auricular or occipital adenopathy.     Cervical: Cervical adenopathy present.     Left cervical: Superficial cervical adenopathy present.  Neurological:     Mental Status: She is alert.       Results for orders placed or performed in visit on 02/23/19 (from the past 72 hour(s))  Hepatitis B surface antigen     Status: None   Collection Time: 02/23/19 10:01 AM  Result Value Ref Range   Hepatitis B Surface Ag NON-REACTIVE NON-REACTI  Hepatitis C antibody     Status: None   Collection Time: 02/23/19 10:01 AM  Result Value Ref Range   Hepatitis C Ab NON-REACTIVE NON-REACTI   SIGNAL TO CUT-OFF 0.05 <1.00    Comment: . HCV antibody was non-reactive. There is no laboratory  evidence of HCV infection. . In most cases, no further action is required. However, if recent HCV exposure is suspected, a test for HCV RNA (test code 4420878060) is suggested. . For additional information please refer to http://education.[facility].com/faq/FAQ22v1 (This link is being provided for informational/ educational purposes only.) .   HIV Antibody (routine testing w rflx)     Status: None   Collection Time: 02/23/19 10:01 AM  Result Value Ref Range   HIV 1&2 Ab, 4th Generation  NON-REACTIVE NON-REACTI    Comment: HIV-1 antigen and HIV-1/HIV-2 antibodies were not detected. There is no laboratory evidence of HIV infection. Marland Kitchen PLEASE NOTE: This information has been disclosed to you from records whose confidentiality may be protected by state law.  If your state requires such protection, then the state law prohibits you from making any further disclosure of the information without the specific written consent of the  person to whom it pertains, or as otherwise permitted by law. A general authorization for the release of medical or other information is NOT sufficient for this purpose. . For additional information please refer to http://education.[facility].com/faq/FAQ106 (This link is being provided for informational/ educational purposes only.) . Marland Kitchen The performance of this assay has not been clinically validated in patients less than 79 years old. .   RPR     Status: None   Collection Time: 02/23/19 10:01 AM  Result Value Ref Range   RPR Ser Ql NON-REACTIVE NON-REACTI   US Soft Tissue Head & Neck (non-thyroid)  Result Date: 02/24/2019 CLINICAL DATA:  Left anterior neck submandibular lymphadenopathy. Patient has noticed enlarged lymph nodes for 9 days. EXAM: ULTRASOUND OF HEAD/NECK SOFT TISSUES TECHNIQUE: Ultrasound examination of the head and neck soft tissues was performed in the area of clinical concern. COMPARISON:  None. FINDINGS: Multiple benign appearing lymph nodes are present in the neck bilaterally. 2 left submandibular nodes are present in the region of concern. These measure 0.70.6 cm respectively. Three separate areas in the right lateral neck measuring 1.3, 1.1, and 0.9 cm respectively. IMPRESSION: Bilateral anterior neck lymph nodes appear reactive and benign. Nodes are larger on the right. Electronically Signed   By: San Morelle M.D.   On: 02/24/2019 12:54      Assessment and Plan: 63 y.o. female with   .Tywana was seen today for  lymphadenopathy.  Diagnoses and all orders for this visit:  Submandibular lymphadenopathy -     US SOFT TISSUE HEAD & NECK (NON-THYROID) -     CBC with Differential/Platelet -     azithromycin (ZITHROMAX Z-PAK) 250 MG tablet; Take 2 tablets (500 mg) on  Day 1,  followed by 1 tablet (250 mg) once daily on Days 2 through 5.  Mixed hyperlipidemia -     Lipid Panel w/reflex Direct LDL  Medication monitoring encounter -     COMPLETE METABOLIC PANEL WITH GFR -     Lipid Panel w/reflex Direct LDL -     TSH  Hypothyroidism, unspecified type -     TSH  Screening for diabetes mellitus -     COMPLETE METABOLIC PANEL WITH GFR   Differential includes acute lymphadenitis, salivary duct obstruction/sialenitis, parotitis Treating empirically for bacterial infection with Azithromycin [place] and CBC w diff pending    Patient education and anticipatory guidance given Patient agrees with treatment plan Follow-up in 1 week or sooner as needed if symptoms worsen or fail to improve  Darlyne Russian PA-C

## 2019-02-24 NOTE — Patient Instructions (Signed)
Lymphadenopathy    Lymphadenopathy means that your lymph glands are swollen or larger than normal (enlarged). Lymph glands, also called lymph nodes, are collections of tissue that filter bacteria, viruses, and waste from your bloodstream. They are part of your body's disease-fighting system (immune system), which protects your body from germs.  There may be different causes of lymphadenopathy, depending on where it is in your body. Some types go away on their own. Lymphadenopathy can occur anywhere that you have lymph glands, including these areas:  · Neck (cervical lymphadenopathy).  · Chest (mediastinal lymphadenopathy).  · Lungs (hilar lymphadenopathy).  · Underarms (axillary lymphadenopathy).  · Groin (inguinal lymphadenopathy).  When your immune system responds to germs, infection-fighting cells and fluid build up in your lymph glands. This causes some swelling and enlargement. If the lymph glands do not go back to normal after you have an infection or disease, your health care provider may do tests. These tests help to monitor your condition and find the reason why the glands are still swollen and enlarged.  Follow these instructions at home:  · Get plenty of rest.  · Take over-the-counter and prescription medicines only as told by your health care provider. Your health care provider may recommend over-the-counter medicines for pain.  · If directed, apply heat to swollen lymph glands as often as told by your health care provider. Use the heat source that your health care provider recommends, such as a moist heat pack or a heating pad.  ? Place a towel between your skin and the heat source.  ? Leave the heat on for 20-30 minutes.  ? Remove the heat if your skin turns bright red. This is especially important if you are unable to feel pain, heat, or cold. You may have a greater risk of getting burned.  · Check your affected lymph glands every day for changes. Check other lymph gland areas as told by your health  care provider. Check for changes such as:  ? More swelling.  ? Sudden increase in size.  ? Redness or pain.  ? Hardness.  · Keep all follow-up visits as told by your health care provider. This is important.  Contact a health care provider if you have:  · Swelling that gets worse or spreads to other areas.  · Problems with breathing.  · Lymph glands that:  ? Are still swollen after 2 weeks.  ? Have suddenly gotten bigger.  ? Are red, painful, or hard.  · A fever or chills.  · Fatigue.  · A sore throat.  · Pain in your abdomen.  · Weight loss.  · Night sweats.  Get help right away if you have:  · Fluid leaking from an enlarged lymph gland.  · Severe pain.  · Chest pain.  · Shortness of breath.  Summary  · Lymphadenopathy means that your lymph glands are swollen or larger than normal (enlarged).  · Lymph glands (also called lymph nodes) are collections of tissue that filter bacteria, viruses, and waste from the bloodstream. They are part of your body's disease-fighting system (immune system).  · Lymphadenopathy can occur anywhere that you have lymph glands.  · If your enlarged and swollen lymph glands do not go back to normal after you have an infection or disease, your health care provider may do tests to monitor your condition and find the reason why the glands are still swollen and enlarged.  · Check your affected lymph glands every day for changes. Check other lymph   gland areas as told by your health care provider.  This information is not intended to replace advice given to you by your health care provider. Make sure you discuss any questions you have with your health care provider.  Document Released: 09/25/2008 Document Revised: 11/01/2017 Document Reviewed: 11/01/2017  Elsevier Interactive Patient Education © 2019 [employer]

## 2019-02-25 LAB — COMPLETE METABOLIC PANEL WITH GFR
AG Ratio: 1.9 (calc) (ref 1.0–2.5)
ALBUMIN MSPROF: 4.5 g/dL (ref 3.6–5.1)
ALT: 17 U/L (ref 6–29)
AST: 18 U/L (ref 10–35)
Alkaline phosphatase (APISO): 72 U/L (ref 37–153)
BUN: 14 mg/dL (ref 7–25)
CHLORIDE: 106 mmol/L (ref 98–110)
CO2: 26 mmol/L (ref 20–32)
CREATININE: 0.67 mg/dL (ref 0.50–0.99)
Calcium: 9.7 mg/dL (ref 8.6–10.4)
GFR, EST AFRICAN AMERICAN: 109 mL/min/{1.73_m2} (ref >=60)
GFR, Est Non African American: 94 mL/min/{1.73_m2} (ref >=60)
GLUCOSE: 96 mg/dL (ref 65–99)
Globulin: 2.4 g/dL (calc) (ref 1.9–3.7)
Potassium: 4.6 mmol/L (ref 3.5–5.3)
Sodium: 143 mmol/L (ref 135–146)
TOTAL PROTEIN: 6.9 g/dL (ref 6.1–8.1)
Total Bilirubin: 0.4 mg/dL (ref 0.2–1.2)

## 2019-02-25 LAB — CBC WITH DIFFERENTIAL/PLATELET
Absolute Monocytes: 616 cells/uL (ref 200–950)
Basophils Absolute: 126 cells/uL (ref 0–200)
Basophils Relative: 1.6 %
EOS PCT: 2 %
Eosinophils Absolute: 158 cells/uL (ref 15–500)
HCT: 43.4 % (ref 35.0–45.0)
Hemoglobin: 15 g/dL (ref 11.7–15.5)
Lymphs Abs: 2718 cells/uL (ref 850–3900)
MCH: 33.3 pg — ABNORMAL HIGH (ref 27.0–33.0)
MCHC: 34.6 g/dL (ref 32.0–36.0)
MCV: 96.4 fL (ref 80.0–100.0)
MPV: 9.7 fL (ref 7.5–12.5)
Monocytes Relative: 7.8 %
NEUTROS PCT: 54.2 %
Neutro Abs: 4282 cells/uL (ref 1500–7800)
PLATELETS: 420 10*3/uL — AB (ref 140–400)
RBC: 4.5 10*6/uL (ref 3.80–5.10)
RDW: 12 % (ref 11.0–15.0)
TOTAL LYMPHOCYTE: 34.4 %
WBC: 7.9 10*3/uL (ref 3.8–10.8)

## 2019-02-25 LAB — CYTOLOGY - PAP
Diagnosis: NEGATIVE
HPV (WINDOPATH): NOT DETECTED

## 2019-02-25 LAB — TSH: TSH: 1.56 mIU/L (ref 0.40–4.50)

## 2019-02-25 LAB — LIPID PANEL W/REFLEX DIRECT LDL
CHOL/HDL RATIO: 2.7 (calc) (ref <5.0)
CHOLESTEROL: 175 mg/dL (ref <200)
HDL: 66 mg/dL (ref >=50)
LDL CHOLESTEROL (CALC): 90 mg/dL
Non-HDL Cholesterol (Calc): 109 mg/dL (calc) (ref <130)
TRIGLYCERIDES: 96 mg/dL (ref <150)

## 2019-02-26 ENCOUNTER — Other Ambulatory Visit: Payer: Self-pay | Admitting: Obstetrics & Gynecology

## 2019-02-26 MED ORDER — METRONIDAZOLE 500 MG PO TABS: ORAL_TABLET | ORAL | 0 refills | Status: DC

## 2019-02-26 NOTE — Progress Notes (Signed)
Flagyl prescribed for trich Message sent to patient.

## 2019-02-27 ENCOUNTER — Other Ambulatory Visit: Payer: Self-pay | Admitting: Family Medicine

## 2019-02-27 DIAGNOSIS — E782 Mixed hyperlipidemia: Secondary | ICD-10-CM

## 2019-02-27 DIAGNOSIS — B001 Herpesviral vesicular dermatitis: Secondary | ICD-10-CM

## 2019-03-04 ENCOUNTER — Ambulatory Visit (INDEPENDENT_AMBULATORY_CARE_PROVIDER_SITE_OTHER): Payer: Managed Care, Other (non HMO) | Admitting: Physician Assistant

## 2019-03-04 ENCOUNTER — Ambulatory Visit (INDEPENDENT_AMBULATORY_CARE_PROVIDER_SITE_OTHER): Payer: Managed Care, Other (non HMO)

## 2019-03-04 ENCOUNTER — Encounter: Payer: Self-pay | Admitting: Physician Assistant

## 2019-03-04 ENCOUNTER — Ambulatory Visit: Payer: Managed Care, Other (non HMO)

## 2019-03-04 VITALS — BP 141/84 | HR 82 | Temp 97.8°F | Wt 149.0 lb

## 2019-03-04 DIAGNOSIS — G9331 Postviral fatigue syndrome: Secondary | ICD-10-CM

## 2019-03-04 DIAGNOSIS — R590 Localized enlarged lymph nodes: Secondary | ICD-10-CM | POA: Diagnosis not present

## 2019-03-04 DIAGNOSIS — Z1231 Encounter for screening mammogram for malignant neoplasm of breast: Secondary | ICD-10-CM | POA: Diagnosis not present

## 2019-03-04 DIAGNOSIS — R030 Elevated blood-pressure reading, without diagnosis of hypertension: Secondary | ICD-10-CM

## 2019-03-04 DIAGNOSIS — G933 Postviral fatigue syndrome: Secondary | ICD-10-CM | POA: Diagnosis not present

## 2019-03-04 DIAGNOSIS — H6981 Other specified disorders of Eustachian tube, right ear: Secondary | ICD-10-CM

## 2019-03-04 MED ORDER — FLUTICASONE PROPIONATE 50 MCG/ACT NA SUSP: 1.0000 | Freq: Every day | NASAL | 6 refills | Status: AC

## 2019-03-04 NOTE — Patient Instructions (Signed)

## 2019-03-04 NOTE — Progress Notes (Signed)
HPI:                                                                Taylor Dodson is a 63 y.o. female who presents to [facility] today for lymphadenopathy follow-up  For over a week has noted left sided, painful neck swelling just below her jaw that she describes as a swollen lymph node. Has felt more fatigued and had a daily persistent headache. No fever, neck stiffness, sore throat, dysphagia, dental pain, ear pain. Headache is bilateral frontal, rated as mild, described as throbbing, no associated photophobia, phonophobia, nausea or dizziness. Does not feel like a migraine according to her.  Interval hx 03/04/2019 US soft tissue neck showed bilateral reactive anterior lymph nodes, 2 left submandibular nodes measuring 0.70 and 0.60 cm in the area of her symptoms CBC showed normal WBC count, with mildly increased platelets Reports she could still feel the lymph nodes yesterday, but not today No fever, chills, weight loss Still feeling fatigued  Headaches have resolved Also endorses intermittent right ear pain. No hearing loss, tinnitus or otorrhea  Past Medical History:  Diagnosis Date  . Anxiety   . Asthma    pt states was informed had workplace asthma states since moving to [place] has not had any difficulties  . Calculus of gallbladder with acute cholecystitis 03/02/2016   Dr. Ninfa Linden with [facility] March 2017   . 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 History of bronchitis   . Hyperlipidemia    Past Surgical History:  Procedure Laterality Date  . CHOLECYSTECTOMY N/A 03/09/2016   Procedure: LAPAROSCOPIC CHOLECYSTECTOMY;  Surgeon: Coralie Keens, MD;  Location: [facility];  Service: General;  Laterality: N/A;  . neck tumor     benign  . TONSILLECTOMY     Social History   Tobacco Use  . Smoking status: Never Smoker  . Smokeless tobacco: Never Used  Substance Use Topics  . Alcohol use: No    Alcohol/week: 0.0  standard drinks   family history includes Cancer in her mother; Hyperlipidemia in her brother; Hypertension in her father; Mitral valve prolapse in her father; Pancreatic cancer in her mother; Stroke in her father; Thyroid disease in her sister.    ROS: negative except as noted in the HPI  Medications: Current Outpatient Medications  Medication Sig Dispense Refill  . acyclovir (ZOVIRAX) 400 MG tablet Take 0.5 tablets (200 mg total) by mouth daily. 45 tablet 3  . aspirin 81 MG tablet Take 81 mg by mouth daily.     . clobetasol ointment (TEMOVATE) 0.05 % Apply to affected area every night for 4 weeks, then every other day for 4 weeks and then twice a week for 4 weeks or until resolution. 30 g 5  . fluticasone (FLONASE) 50 MCG/ACT nasal spray Place 1 spray into both nostrils daily. 16 g 6  . Multiple Vitamin (MULTIVITAMIN) tablet Take 1 tablet by mouth daily.     . pravastatin (PRAVACHOL) 40 MG tablet Take 1 tablet (40 mg total) by mouth at bedtime. 90 tablet 3   No current facility-administered medications for this visit.    Allergies  Allergen Reactions  . Augmentin [Amoxicillin-Pot Clavulanate] Hives and Shortness Of Breath    .Marland Kitchen  Has patient had a PCN reaction causing immediate rash, facial/tongue/throat swelling, SOB or lightheadedness with hypotension: Yes Has patient had a PCN reaction causing severe rash involving mucus membranes or skin necrosis: No Has patient had a PCN reaction that required hospitalization No Has patient had a PCN reaction occurring within the last 10 years: No If all of the above answers are "NO", then may proceed with Cephalosporin use.   Loma Sousa [Escitalopram Oxalate] Rash    Red blotching on face and neck,rash       Objective:  BP (!) 141/84   Pulse 82   Temp 97.8 F (36.6 C) (Oral)   Wt 149 lb (67.6 kg)   BMI 23.34 kg/m   Vitals:   03/04/19 1034 03/04/19 1044  BP: 140/80 (!) 141/84  Pulse: 82   Temp: 97.8 F (36.6 C)    Physical  Exam Constitutional:      Appearance: Normal appearance. She is normal weight. She is not ill-appearing.  HENT:     Right Ear: Hearing and tympanic membrane normal.     Left Ear: Hearing and tympanic membrane normal.     Mouth/Throat:     Lips: Pink.     Mouth: Mucous membranes are moist.     Dentition: Normal dentition.     Tongue: No lesions.     Pharynx: Oropharynx is clear. Uvula midline.     Tonsils: No tonsillar exudate.  Neck:     Musculoskeletal: Full passive range of motion without pain, normal range of motion and neck supple. No neck rigidity.     Thyroid: No thyroid mass, thyromegaly or thyroid tenderness.     Trachea: Trachea and phonation normal.  Cardiovascular:     Rate and Rhythm: Normal rate and regular rhythm.     Heart sounds: Normal heart sounds. No murmur.  Pulmonary:     Effort: Pulmonary effort is normal.     Breath sounds: Normal breath sounds and air entry. No wheezing, rhonchi or rales.  Lymphadenopathy:     Head:     Right side of head: No submental, submandibular, tonsillar, preauricular, posterior auricular or occipital adenopathy.     Left side of head: No submental, submandibular, tonsillar, preauricular, posterior auricular or occipital adenopathy.     Cervical: No cervical adenopathy.  Neurological:     Mental Status: She is alert.     CLINICAL DATA:  Left anterior neck submandibular lymphadenopathy. Patient has noticed enlarged lymph nodes for 9 days.  EXAM: ULTRASOUND OF HEAD/NECK SOFT TISSUES  TECHNIQUE: Ultrasound examination of the head and neck soft tissues was performed in the area of clinical concern.  COMPARISON:  None.  FINDINGS: Multiple benign appearing lymph nodes are present in the neck bilaterally. 2 left submandibular nodes are present in the region of concern. These measure 0.70.6 cm respectively. Three separate areas in the right lateral neck measuring 1.3, 1.1, and 0.9 cm respectively.  IMPRESSION: Bilateral  anterior neck lymph nodes appear reactive and benign. Nodes are larger on the right.   Electronically Signed   By: San Morelle M.D.   On: 02/24/2019 12:54  Lab Results  Component Value Date   WBC 7.9 02/24/2019   HGB 15.0 02/24/2019   HCT 43.4 02/24/2019   MCV 96.4 02/24/2019   PLT 420 (H) 02/24/2019     No results found for this or any previous visit (from the past 72 hour(s)). No results found.    Assessment and Plan: 63 y.o. female with   .Diagnoses  and all orders for this visit:  Dysfunction of right eustachian tube -     fluticasone (FLONASE) 50 MCG/ACT nasal spray; Place 1 spray into both nostrils daily.  Submandibular lymphadenopathy  Postviral fatigue syndrome   Clinically improved submandibular adenopathy Counseled on diagnosis and treatment of eustachian tube dysfunction  BP elevated on 2 in office checks. No prior history of HTN. Counseled to monitor and log BP's at home  Patient education and anticipatory guidance given Patient agrees with treatment plan Follow-up as needed if symptoms worsen or fail to improve  Darlyne Russian PA-C

## 2019-03-10 ENCOUNTER — Encounter: Payer: Self-pay | Admitting: Physician Assistant

## 2019-03-10 DIAGNOSIS — R030 Elevated blood-pressure reading, without diagnosis of hypertension: Secondary | ICD-10-CM | POA: Insufficient documentation

## 2019-03-10 DIAGNOSIS — H6981 Other specified disorders of Eustachian tube, right ear: Secondary | ICD-10-CM | POA: Insufficient documentation

## 2019-03-27 ENCOUNTER — Other Ambulatory Visit: Payer: Self-pay

## 2019-03-27 DIAGNOSIS — B001 Herpesviral vesicular dermatitis: Secondary | ICD-10-CM

## 2019-03-27 MED ORDER — ACYCLOVIR 400 MG PO TABS: 200.0000 mg | ORAL_TABLET | Freq: Every day | ORAL | 3 refills | Status: AC

## 2019-05-16 ENCOUNTER — Other Ambulatory Visit: Payer: Self-pay | Admitting: Family Medicine

## 2019-05-16 DIAGNOSIS — E782 Mixed hyperlipidemia: Secondary | ICD-10-CM

## 2020-03-26 ENCOUNTER — Ambulatory Visit
Admit: 2020-03-26 | Payer: BLUE CROSS/BLUE SHIELD | Attending: Student in an Organized Health Care Education/Training Program | Primary: Family

## 2020-03-26 DIAGNOSIS — Z23 Encounter for immunization: Secondary | ICD-10-CM

## 2020-04-23 ENCOUNTER — Ambulatory Visit: Admit: 2020-04-23 | Payer: PRIVATE HEALTH INSURANCE | Primary: Family

## 2020-04-24 DIAGNOSIS — Z23 Encounter for immunization: Secondary | ICD-10-CM

## 2020-05-18 MED ORDER — CANDESARTAN 16 MG TABLET
16 | ORAL_TABLET | ORAL | 1 refills | 60.00000 days | Status: AC
Start: 2020-05-18 — End: 2020-08-08

## 2020-05-18 MED ORDER — FENOFIBRIC ACID (CHOLINE) 135 MG CAPSULE,DELAYED RELEASE
135 | ORAL_CAPSULE | ORAL | 1 refills | 90.00000 days | Status: AC
Start: 2020-05-18 — End: 2020-08-08

## 2020-05-18 MED ORDER — LOVASTATIN 20 MG TABLET
20 | ORAL_TABLET | ORAL | 1 refills | 90.00000 days | Status: AC
Start: 2020-05-18 — End: 2020-08-08

## 2020-08-08 MED ORDER — FENOFIBRIC ACID (CHOLINE) 135 MG CAPSULE,DELAYED RELEASE
135 | ORAL_CAPSULE | ORAL | 1 refills | 90.00000 days | Status: AC
Start: 2020-08-08 — End: 2020-11-16

## 2020-08-08 MED ORDER — LOVASTATIN 20 MG TABLET
20 | ORAL_TABLET | ORAL | 1 refills | 90.00000 days | Status: AC
Start: 2020-08-08 — End: 2020-11-16

## 2020-08-08 MED ORDER — CANDESARTAN 16 MG TABLET
16 | ORAL_TABLET | ORAL | 1 refills | 60.00000 days | Status: AC
Start: 2020-08-08 — End: 2020-11-16

## 2020-09-01 ENCOUNTER — Inpatient Hospital Stay: Admit: 2020-09-01 | Discharge: 2020-09-01 | Payer: BLUE CROSS/BLUE SHIELD | Primary: Family

## 2020-09-01 ENCOUNTER — Ambulatory Visit: Admit: 2020-09-01 | Payer: PRIVATE HEALTH INSURANCE | Attending: Family | Primary: Family

## 2020-09-01 DIAGNOSIS — R3915 Urgency of urination: Secondary | ICD-10-CM

## 2020-09-01 MED ORDER — NITROFURANTOIN MONOHYDRATE/MACROCRYSTALS 100 MG CAPSULE
100 | ORAL_CAPSULE | Freq: Two times a day (BID) | ORAL | 1 refills | 5.00000 days | Status: AC
Start: 2020-09-01 — End: ?

## 2020-09-16 ENCOUNTER — Ambulatory Visit: Admit: 2020-09-16 | Payer: PRIVATE HEALTH INSURANCE | Attending: Family | Primary: Family

## 2020-09-16 ENCOUNTER — Encounter: Admit: 2020-09-16 | Payer: PRIVATE HEALTH INSURANCE | Primary: Family

## 2020-09-16 DIAGNOSIS — R399 Unspecified symptoms and signs involving the genitourinary system: Secondary | ICD-10-CM

## 2020-09-16 MED ORDER — CEPHALEXIN 500 MG CAPSULE
500 | ORAL_CAPSULE | Freq: Two times a day (BID) | ORAL | 1 refills | 7.00000 days | Status: AC
Start: 2020-09-16 — End: ?

## 2020-09-18 LAB — URINE CULTURE

## 2020-11-16 MED ORDER — LOVASTATIN 20 MG TABLET
20 | ORAL_TABLET | ORAL | 1 refills | 90.00000 days | Status: AC
Start: 2020-11-16 — End: 2021-02-14

## 2020-11-16 MED ORDER — CANDESARTAN 16 MG TABLET
16 | ORAL_TABLET | Freq: Every day | ORAL | 1 refills | 60.00000 days | Status: AC
Start: 2020-11-16 — End: 2020-12-21

## 2020-11-16 MED ORDER — FENOFIBRIC ACID (CHOLINE) 135 MG CAPSULE,DELAYED RELEASE
135 | ORAL_CAPSULE | Freq: Every day | ORAL | 1 refills | 90.00000 days | Status: AC
Start: 2020-11-16 — End: 2021-02-14

## 2020-11-23 ENCOUNTER — Encounter: Admit: 2020-11-23 | Payer: PRIVATE HEALTH INSURANCE | Attending: Family Medicine | Primary: Family

## 2020-11-23 DIAGNOSIS — Z1231 Encounter for screening mammogram for malignant neoplasm of breast: Secondary | ICD-10-CM

## 2020-12-02 MED ORDER — ALLOPURINOL 100 MG TABLET
100 | ORAL_TABLET | ORAL | 4 refills | 90.00000 days | Status: AC
Start: 2020-12-02 — End: 2021-11-21

## 2020-12-21 ENCOUNTER — Inpatient Hospital Stay: Admit: 2020-12-21 | Discharge: 2020-12-21 | Payer: BLUE CROSS/BLUE SHIELD | Primary: Family

## 2020-12-21 DIAGNOSIS — R739 Hyperglycemia, unspecified: Secondary | ICD-10-CM

## 2020-12-21 DIAGNOSIS — Z0000 Encounter for general adult medical examination without abnormal findings: Secondary | ICD-10-CM

## 2020-12-21 DIAGNOSIS — E7800 Pure hypercholesterolemia, unspecified: Secondary | ICD-10-CM

## 2020-12-21 DIAGNOSIS — D751 Secondary polycythemia: Secondary | ICD-10-CM

## 2020-12-21 DIAGNOSIS — E781 Pure hyperglyceridemia: Secondary | ICD-10-CM

## 2020-12-21 DIAGNOSIS — Z139 Encounter for screening, unspecified: Secondary | ICD-10-CM

## 2020-12-21 LAB — CBC WITH AUTO DIFFERENTIAL
BKR WAM ABSOLUTE IMMATURE GRANULOCYTES.: 0.02 x 1000/ÂµL (ref 0.00–0.30)
BKR WAM ABSOLUTE LYMPHOCYTE COUNT.: 1.77 x 1000/ÂµL (ref 0.60–3.70)
BKR WAM ABSOLUTE NRBC (2 DEC): 0 x 1000/??L (ref 0.00–1.00)
BKR WAM ANALYZER ANC: 3.09 x 1000/ÂµL (ref 2.00–7.60)
BKR WAM BASOPHIL ABSOLUTE COUNT.: 0.06 x 1000/ÂµL (ref 0.00–1.00)
BKR WAM BASOPHILS: 1.1 % (ref 0.0–1.4)
BKR WAM EOSINOPHIL ABSOLUTE COUNT.: 0.21 x 1000/??L (ref 0.00–1.00)
BKR WAM EOSINOPHILS: 3.8 % (ref 0.0–5.0)
BKR WAM HEMATOCRIT (2 DEC): 46.3 % — ABNORMAL HIGH (ref 35.00–45.00)
BKR WAM HEMOGLOBIN: 15.7 g/dL — ABNORMAL HIGH (ref 11.7–15.5)
BKR WAM IMMATURE GRANULOCYTES: 0.4 % (ref 0.0–1.0)
BKR WAM LYMPHOCYTES: 32.2 % (ref 17.0–50.0)
BKR WAM MCH (PG): 31.5 pg (ref 27.0–33.0)
BKR WAM MCHC: 33.9 g/dL (ref 31.0–36.0)
BKR WAM MCV: 93 fL (ref 80.0–100.0)
BKR WAM MONOCYTE ABSOLUTE COUNT.: 0.35 x 1000/ÂµL (ref 0.00–1.00)
BKR WAM MONOCYTES: 6.4 % (ref 4.0–12.0)
BKR WAM MPV: 10.3 fL (ref 8.0–12.0)
BKR WAM NEUTROPHILS: 56.1 % (ref 39.0–72.0)
BKR WAM PLATELETS: 244 x1000/??L (ref 150–420)
BKR WAM RDW-CV: 12.9 % (ref 11.0–15.0)
BKR WAM RED BLOOD CELL COUNT.: 4.98 M/??L (ref 4.00–6.00)
BKR WAM WHITE BLOOD CELL COUNT: 5.5 x1000/??L (ref 4.0–11.0)

## 2020-12-21 LAB — COMPREHENSIVE METABOLIC PANEL
BKR A/G RATIO: 1.8 x 1000/??L (ref 1.0–2.2)
BKR ALANINE AMINOTRANSFERASE (ALT): 27 U/L (ref 10–35)
BKR ALBUMIN: 4.8 g/dL (ref 3.6–4.9)
BKR ALKALINE PHOSPHATASE: 56 U/L (ref 9–122)
BKR ANION GAP: 12 g/dL (ref 7–17)
BKR ASPARTATE AMINOTRANSFERASE (AST): 22 U/L (ref 10–35)
BKR AST/ALT RATIO: 0.8
BKR BILIRUBIN TOTAL: 0.5 mg/dL (ref <=1.2)
BKR BLOOD UREA NITROGEN: 23 mg/dL (ref 8–23)
BKR BUN / CREAT RATIO: 20.9 % (ref 8.0–23.0)
BKR CALCIUM: 9.5 mg/dL (ref 8.8–10.2)
BKR CHLORIDE: 103 mmol/L (ref 98–107)
BKR CO2: 27 mmol/L (ref 20–30)
BKR CREATININE: 1.1 mg/dL (ref 0.40–1.30)
BKR EGFR (AFR AMER): >60 mL/min/{1.73_m2} (ref >60)
BKR EGFR (NON AFRICAN AMERICAN): 50 mL/min/{1.73_m2} (ref >60)
BKR GLOBULIN: 2.7 g/dL (ref 0.60–3.70)
BKR GLUCOSE: 91 mg/dL (ref 70–100)
BKR POTASSIUM: 4.6 mmol/L — ABNORMAL HIGH (ref 3.3–5.3)
BKR PROTEIN TOTAL: 7.5 g/dL (ref 6.6–8.7)
BKR SODIUM: 142 mmol/L — ABNORMAL HIGH (ref 136–144)
BKR WAM NUCLEATED RED BLOOD CELLS: 27 U/L (ref 10–35)

## 2020-12-21 LAB — LIPID PANEL
BKR CHOLESTEROL/HDL RATIO: 4.5 (ref 0.0–5.0)
BKR CHOLESTEROL: 174 mg/dL
BKR HDL CHOLESTEROL: 39 mg/dL — ABNORMAL LOW (ref >=40)
BKR LDL CHOLESTEROL CALCULATED: 90 mg/dL
BKR TRIGLYCERIDES: 227 mg/dL — ABNORMAL HIGH

## 2020-12-21 LAB — HEMOGLOBIN A1C
BKR ESTIMATED AVERAGE GLUCOSE: 123 mg/dL (ref 0.0–5.0)
BKR HEMOGLOBIN A1C: 5.9 % — ABNORMAL HIGH (ref 4.0–5.6)

## 2020-12-21 LAB — TSH W/REFLEX TO FT4     (BH GH LMW Q YH): BKR THYROID STIMULATING HORMONE: 2.59 u[IU]/mL

## 2020-12-21 LAB — HIV-1/HIV-2 ANTIBODY/ANTIGEN SCREEN W/REFLEX     (BH GH LMW YH): BKR HIV 1/2 ANTIBODY SCREEN (BH): NONREACTIVE

## 2020-12-21 MED ORDER — OLMESARTAN 20 MG TABLET
20 | ORAL | 4.00 refills | 90.00000 days | Status: AC
Start: 2020-12-21 — End: 2020-12-21

## 2020-12-21 MED ORDER — CANDESARTAN 16 MG TABLET
16 | ORAL_TABLET | Freq: Every day | ORAL | 4 refills | 60.00000 days | Status: AC
Start: 2020-12-21 — End: 2021-08-30

## 2020-12-22 ENCOUNTER — Inpatient Hospital Stay: Admit: 2020-12-22 | Discharge: 2020-12-22 | Payer: BLUE CROSS/BLUE SHIELD | Primary: Family

## 2020-12-22 DIAGNOSIS — Z1231 Encounter for screening mammogram for malignant neoplasm of breast: Secondary | ICD-10-CM

## 2020-12-22 NOTE — Other
Patient informed. 

## 2020-12-26 ENCOUNTER — Inpatient Hospital Stay: Admit: 2020-12-26 | Discharge: 2020-12-26 | Payer: BLUE CROSS/BLUE SHIELD | Primary: Family

## 2020-12-26 DIAGNOSIS — Z139 Encounter for screening, unspecified: Secondary | ICD-10-CM

## 2020-12-27 NOTE — Other
Patient informed. 

## 2021-02-14 MED ORDER — FENOFIBRIC ACID (CHOLINE) 135 MG CAPSULE,DELAYED RELEASE
135 | ORAL_CAPSULE | ORAL | 1 refills | 90.00000 days | Status: AC
Start: 2021-02-14 — End: 2021-05-08

## 2021-02-14 MED ORDER — LOVASTATIN 20 MG TABLET
20 | ORAL_TABLET | ORAL | 1 refills | 90.00000 days | Status: AC
Start: 2021-02-14 — End: 2021-05-08

## 2021-05-08 MED ORDER — FENOFIBRIC ACID (CHOLINE) 135 MG CAPSULE,DELAYED RELEASE
135 | ORAL_CAPSULE | Freq: Every day | ORAL | 1 refills | 90.00000 days | Status: AC
Start: 2021-05-08 — End: 2021-08-03

## 2021-05-08 MED ORDER — LOVASTATIN 20 MG TABLET
20 | ORAL_TABLET | ORAL | 1 refills | 90.00000 days | Status: AC
Start: 2021-05-08 — End: 2021-08-03

## 2021-08-03 MED ORDER — FENOFIBRIC ACID (CHOLINE) 135 MG CAPSULE,DELAYED RELEASE
135 | ORAL_CAPSULE | Freq: Every day | ORAL | 1 refills | 90.00000 days | Status: AC
Start: 2021-08-03 — End: 2021-11-06

## 2021-08-03 MED ORDER — LOVASTATIN 20 MG TABLET
20 | ORAL_TABLET | ORAL | 1 refills | 90.00000 days | Status: AC
Start: 2021-08-03 — End: 2021-11-06

## 2021-08-30 MED ORDER — CANDESARTAN 16 MG TABLET
16 | ORAL_TABLET | Freq: Every day | ORAL | 4 refills | 60.00000 days | Status: AC
Start: 2021-08-30 — End: 2022-01-02

## 2021-09-29 ENCOUNTER — Encounter: Admit: 2021-09-29 | Payer: PRIVATE HEALTH INSURANCE | Attending: Family Medicine | Primary: Family

## 2021-09-29 DIAGNOSIS — Z1231 Encounter for screening mammogram for malignant neoplasm of breast: Secondary | ICD-10-CM

## 2021-10-24 ENCOUNTER — Other Ambulatory Visit: Payer: Self-pay | Admitting: Infectious Diseases

## 2021-10-24 DIAGNOSIS — Z1231 Encounter for screening mammogram for malignant neoplasm of breast: Secondary | ICD-10-CM

## 2021-11-06 MED ORDER — FENOFIBRIC ACID (CHOLINE) 135 MG CAPSULE,DELAYED RELEASE
135 | ORAL_CAPSULE | ORAL | 1 refills | 90.00000 days | Status: AC
Start: 2021-11-06 — End: 2022-02-02

## 2021-11-06 MED ORDER — LOVASTATIN 20 MG TABLET
20 | ORAL_TABLET | ORAL | 1 refills | 90.00000 days | Status: AC
Start: 2021-11-06 — End: 2022-01-08

## 2021-11-21 MED ORDER — ALLOPURINOL 100 MG TABLET
100 | ORAL_TABLET | Freq: Every day | ORAL | 1 refills | 90.00000 days | Status: AC
Start: 2021-11-21 — End: 2022-04-10

## 2021-12-06 ENCOUNTER — Other Ambulatory Visit (HOSPITAL_COMMUNITY)
Admission: RE | Admit: 2021-12-06 | Discharge: 2021-12-06 | Disposition: A | Discharge status: Home / Self Care | Payer: Managed Care, Other (non HMO) | Source: Ambulatory Visit | Attending: Obstetrics & Gynecology | Admitting: Obstetrics & Gynecology

## 2021-12-06 ENCOUNTER — Ambulatory Visit (INDEPENDENT_AMBULATORY_CARE_PROVIDER_SITE_OTHER): Payer: Managed Care, Other (non HMO) | Admitting: Obstetrics & Gynecology

## 2021-12-06 ENCOUNTER — Other Ambulatory Visit: Payer: Self-pay

## 2021-12-06 ENCOUNTER — Encounter: Payer: Self-pay | Admitting: Obstetrics & Gynecology

## 2021-12-06 VITALS — BP 160/79 | HR 114 | Wt 171.0 lb

## 2021-12-06 DIAGNOSIS — Z8619 Personal history of other infectious and parasitic diseases: Secondary | ICD-10-CM

## 2021-12-06 DIAGNOSIS — Z01419 Encounter for gynecological examination (general) (routine) without abnormal findings: Secondary | ICD-10-CM | POA: Insufficient documentation

## 2021-12-06 NOTE — Progress Notes (Signed)
GYNECOLOGY ANNUAL PREVENTATIVE CARE ENCOUNTER NOTE  History:     Taylor Dodson is a 65 y.o. G0P0000 female here for a routine annual gynecologic exam.  Current complaints: had episode of breast swelling a week ago that has resolved, had similar reaction after COVID vaccination.  Already scheduled for mammogram next month, but has hip replacement surgery next week on 12/16.22 and wants to see if she can move this up.  Denies abnormal vaginal bleeding, discharge, pelvic pain, problems with intercourse or other gynecologic concerns.    Gynecologic History No LMP recorded. Patient is postmenopausal. Contraception: post menopausal status Last Pap: 02/23/2019. Result was normal with negative HPV but showed trichomonal vaginitis. Last Mammogram: 03/04/2019.  Result was normal Last Colonoscopy: 2015.  Result was normal  Obstetric History OB History  Gravida Para Term Preterm AB Living  0 0 0 0 0 0  SAB IAB Ectopic Multiple Live Births  0 0 0 0      Past Medical History:  Diagnosis Date   Anxiety    Asthma    pt states was informed had workplace asthma states since moving to [place] has not had any difficulties   Calculus of gallbladder with acute cholecystitis 03/02/2016   Dr. 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 16 g 6   Multiple Vitamin (MULTIVITAMIN) tablet Take 1 tablet by mouth daily.      pravastatin (PRAVACHOL) 40 MG tablet Take 1 tablet (40 mg  total) by mouth at bedtime. 90 tablet 3   acyclovir (ZOVIRAX) 400 MG tablet Take 0.5 tablets (200 mg total) by mouth daily. 45 tablet 3   aspirin 81 MG tablet Take 81 mg by mouth daily.      clobetasol ointment (TEMOVATE) 0.05 % Apply to affected area every night for 4 weeks, then every other day for 4 weeks and then twice a week for 4 weeks or until resolution. 30 g 5   No current facility-administered medications on file prior to visit.    Allergies  Allergen Reactions   Augmentin [Amoxicillin-Pot Clavulanate] Hives and Shortness Of Breath    .Marland KitchenHas patient had a PCN reaction causing immediate rash, facial/tongue/throat swelling, SOB or lightheadedness with hypotension: Yes Has patient had a PCN reaction causing severe rash involving mucus membranes or skin necrosis: No Has patient had a PCN reaction that required hospitalization No Has patient had a PCN reaction occurring within the last 10 years: No If all of the above answers are "NO", then may proceed with Cephalosporin use.    Lexapro [Escitalopram Oxalate] Rash    Red blotching on face and neck,rash    Social History:  reports that she has never smoked. She has never used smokeless tobacco. She reports that she does not drink alcohol and does not use drugs.  Family History  Problem Relation Age of Onset   Cancer Mother        pancreatic  Pancreatic cancer Mother    Stroke Father    Hypertension Father    Mitral valve prolapse Father    Thyroid disease Sister    Hyperlipidemia Brother     The following portions of the patient's history were reviewed and updated as appropriate: allergies, current medications, past family history, past medical history, past social history, past surgical history and problem list.  Review of Systems Pertinent items noted in HPI and remainder of comprehensive ROS otherwise negative.  Physical Exam:  BP (!) 160/79   Pulse (!) 114   Wt 171 lb (77.6 kg)   BMI 26.78 kg/m  CONSTITUTIONAL:  Well-developed, well-nourished female in no acute distress.  HENT:  Normocephalic, atraumatic, External right and left ear normal.  EYES: Conjunctivae and EOM are normal. Pupils are equal, round, and reactive to light. No scleral icterus.  NECK: Normal range of motion, supple, no masses.  Normal thyroid.  SKIN: Skin is warm and dry. No rash noted. Not diaphoretic. No erythema. No pallor. MUSCULOSKELETAL: Normal range of motion. No tenderness.  No cyanosis, clubbing, or edema. NEUROLOGIC: Alert and oriented to person, place, and time. Normal reflexes, muscle tone coordination.  PSYCHIATRIC: Normal mood and affect. Normal behavior. Normal judgment and thought content. CARDIOVASCULAR: Normal heart rate noted, regular rhythm RESPIRATORY: Clear to auscultation bilaterally. Effort and breath sounds normal, no problems with respiration noted. BREASTS: Symmetric in size. No masses, tenderness, skin changes, nipple drainage, or lymphadenopathy bilaterally. Performed in the presence of a chaperone. ABDOMEN: Soft, no distention noted.  No tenderness, rebound or guarding.  PELVIC: Normal appearing external genitalia and urethral meatus with moderate atrophy; atrophic vaginal mucosa and cervix.  Cervical stenosis noted.  No abnormal vaginal discharge noted.  Pap smear obtained, unable to get endocervical cells due to stenosis.  Normal uterine size, no other palpable masses, no uterine or adnexal tenderness.  Performed in the presence of a chaperone.   Assessment and Plan:     1. Well woman exam with routine gynecological exam 2. History of trichomonal vaginitis Pap done with ancillary STI testing as per patient's request and her history - Cytology - PAP Will follow up results and manage accordingly. Patient was informed of pap smear recommended screening interval, recommendation to cease screening after age 74 if no risk factors for cervical dysplasia.  Breast swelling could be due to inflammatory  lymphadenopathy, fighting off possible URI (she does report a lot of people sick at work), similar to reaction after vaccination. Reassured patient, normal exam today.  Mammogram already scheduled, will try to move this up due to upcoming surgery on 12/15/21. Colon cancer screening is up to date, next colonoscopy due in 2025. Routine preventative health maintenance measures emphasized. Please refer to After Visit Summary for other counseling recommendations.      Verita Schneiders, MD, [place] [facility] [employer], [place]

## 2021-12-11 LAB — CYTOLOGY - PAP
Chlamydia: NEGATIVE
Comment: NEGATIVE
Comment: NEGATIVE
Comment: NEGATIVE
Comment: NORMAL
Diagnosis: NEGATIVE
High risk HPV: NEGATIVE
Neisseria Gonorrhea: NEGATIVE
Trichomonas: NEGATIVE

## 2021-12-26 ENCOUNTER — Inpatient Hospital Stay: Admit: 2021-12-26 | Discharge: 2021-12-26 | Payer: BLUE CROSS/BLUE SHIELD | Primary: Family

## 2021-12-27 ENCOUNTER — Inpatient Hospital Stay: Admit: 2021-12-27 | Discharge: 2021-12-27 | Payer: BLUE CROSS/BLUE SHIELD | Primary: Family

## 2021-12-27 DIAGNOSIS — D751 Secondary polycythemia: Secondary | ICD-10-CM

## 2021-12-27 DIAGNOSIS — N200 Calculus of kidney: Secondary | ICD-10-CM

## 2021-12-27 DIAGNOSIS — E785 Hyperlipidemia, unspecified: Secondary | ICD-10-CM

## 2021-12-27 DIAGNOSIS — R739 Hyperglycemia, unspecified: Secondary | ICD-10-CM

## 2021-12-27 DIAGNOSIS — E669 Obesity, unspecified: Secondary | ICD-10-CM

## 2021-12-27 DIAGNOSIS — E781 Pure hyperglyceridemia: Secondary | ICD-10-CM

## 2021-12-27 DIAGNOSIS — Z0000 Encounter for general adult medical examination without abnormal findings: Secondary | ICD-10-CM

## 2021-12-27 DIAGNOSIS — I10 Essential (primary) hypertension: Secondary | ICD-10-CM

## 2021-12-27 LAB — COMPREHENSIVE METABOLIC PANEL
BKR A/G RATIO: 1.9 (ref 1.0–2.2)
BKR ALANINE AMINOTRANSFERASE (ALT): 28 U/L (ref 10–35)
BKR ALBUMIN: 4.8 g/dL (ref 3.6–4.9)
BKR ALKALINE PHOSPHATASE: 57 U/L (ref 9–122)
BKR ANION GAP: 10 (ref 7–17)
BKR ASPARTATE AMINOTRANSFERASE (AST): 27 U/L (ref 10–35)
BKR AST/ALT RATIO: 1
BKR BILIRUBIN TOTAL: 0.5 mg/dL (ref <=1.2)
BKR BLOOD UREA NITROGEN: 23 mg/dL (ref 8–23)
BKR BUN / CREAT RATIO: 20.9 (ref 8.0–23.0)
BKR CALCIUM: 9.7 mg/dL (ref 8.8–10.2)
BKR CHLORIDE: 101 mmol/L (ref 98–107)
BKR CO2: 26 mmol/L (ref 20–30)
BKR CREATININE: 1.1 mg/dL (ref 0.40–1.30)
BKR EGFR, CREATININE (CKD-EPI 2021): 56 mL/min/{1.73_m2} — ABNORMAL LOW (ref >=60)
BKR GLOBULIN: 2.5 g/dL (ref 2.3–3.5)
BKR GLUCOSE: 104 mg/dL — ABNORMAL HIGH (ref 70–100)
BKR POTASSIUM: 4.7 mmol/L (ref 3.3–5.3)
BKR PROTEIN TOTAL: 7.3 g/dL (ref 6.6–8.7)
BKR SODIUM: 137 mmol/L (ref 136–144)

## 2021-12-27 LAB — TSH W/REFLEX TO FT4     (BH GH LMW Q YH)
BKR LDL CHOLESTEROL SAMPSON CALCULATED: 2.63 ??IU/mL
BKR THYROID STIMULATING HORMONE: 2.63 u[IU]/mL

## 2021-12-27 LAB — CBC WITH AUTO DIFFERENTIAL
BKR WAM ABSOLUTE IMMATURE GRANULOCYTES.: 0.05 x 1000/??L (ref 0.00–0.30)
BKR WAM ABSOLUTE LYMPHOCYTE COUNT.: 1.92 x 1000/??L (ref 0.0–1.0)
BKR WAM ABSOLUTE NRBC (2 DEC): 0 x 1000/??L (ref 0.00–1.00)
BKR WAM ANALYZER ANC: 3.79 x 1000/??L (ref 0.0–1.4)
BKR WAM BASOPHIL ABSOLUTE COUNT.: 0.08 x 1000/??L (ref 0.60–3.70)
BKR WAM EOSINOPHIL ABSOLUTE COUNT.: 0.32 x 1000/??L — ABNORMAL LOW (ref 2.00–7.60)
BKR WAM EOSINOPHILS (DIFF) 2 DEC: 0 x 1000/??L (ref 0.00–1.00)
BKR WAM EOSINOPHILS: 4.9 % (ref 39.0–72.0)
BKR WAM HEMATOCRIT (2 DEC): 48.2 % — ABNORMAL HIGH (ref 35.00–45.00)
BKR WAM HEMOGLOBIN: 16.3 g/dL — ABNORMAL HIGH (ref 11.7–15.5)
BKR WAM IMMATURE GRANULOCYTES: 0.8 % (ref 0.0–1.0)
BKR WAM LYMPHOCYTES: 29.2 % (ref 17.0–50.0)
BKR WAM MCH (PG): 31.7 pg (ref 27.0–33.0)
BKR WAM MCHC: 33.8 g/dL (ref 31.0–36.0)
BKR WAM MCV: 93.8 fL (ref 80.0–100.0)
BKR WAM MONOCYTE ABSOLUTE COUNT.: 0.42 x 1000/??L (ref 0.00–1.00)
BKR WAM MONOCYTES: 6.4 % (ref 4.0–12.0)
BKR WAM MPV: 10.6 fL (ref 8.0–12.0)
BKR WAM NEUTROPHILS: 57.5 % (ref 39.0–72.0)
BKR WAM NUCLEATED RED BLOOD CELLS: 0 % (ref <=0.3)
BKR WAM PLATELETS: 230 x1000/??L (ref 150–420)
BKR WAM RDW-CV: 13.2 % (ref 11.0–15.0)
BKR WAM RED BLOOD CELL COUNT.: 5.14 M/??L (ref 4.00–6.00)
BKR WAM WHITE BLOOD CELL COUNT: 6.6 x1000/??L (ref 4.0–11.0)

## 2021-12-27 LAB — HEMOGLOBIN A1C
BKR ESTIMATED AVERAGE GLUCOSE: 123 mg/dL (ref 0.0–5.0)
BKR HEMOGLOBIN A1C: 5.9 % — ABNORMAL HIGH (ref 4.0–5.6)

## 2021-12-27 LAB — LIPID PANEL
BKR CHOLESTEROL/HDL RATIO: 4.9 (ref 0.0–5.0)
BKR CHOLESTEROL: 171 mg/dL
BKR HDL CHOLESTEROL: 35 mg/dL — ABNORMAL LOW (ref >=40)
BKR TRIGLYCERIDES: 369 mg/dL — ABNORMAL HIGH

## 2021-12-27 NOTE — Other
A1c is on the border of pre diabetes.  So limiting starch intake plus getting some regular physical activity like we discussed today will help keep that level down.Thyroid level is goodCMP/kidney function and liver functions are all goodTriglyceride level remains way too high.  Should be taking fish oil a day plus the exercises we talked about for the sugar.  At some point she may wind on special medicine for just triglycerides.  LDL is good so stay on statin she is onHemoglobin level remains a little higher than it should be as it has been for the last several years.  Will continue to monitor

## 2021-12-31 ENCOUNTER — Encounter: Admit: 2021-12-31 | Payer: PRIVATE HEALTH INSURANCE | Attending: Family | Primary: Family

## 2022-01-02 MED ORDER — CANDESARTAN 16 MG TABLET
16 | ORAL_TABLET | ORAL | 4 refills | 60.00000 days | Status: AC
Start: 2022-01-02 — End: 2022-04-24

## 2022-01-07 ENCOUNTER — Encounter: Admit: 2022-01-07 | Payer: PRIVATE HEALTH INSURANCE | Attending: Family | Primary: Family

## 2022-01-08 MED ORDER — LOVASTATIN 20 MG TABLET
20 | ORAL_TABLET | Freq: Every evening | ORAL | 4 refills | 90.00000 days | Status: AC
Start: 2022-01-08 — End: 2022-04-10

## 2022-01-20 ENCOUNTER — Encounter: Admit: 2022-01-20 | Payer: PRIVATE HEALTH INSURANCE | Attending: Family Medicine | Primary: Family

## 2022-01-20 ENCOUNTER — Inpatient Hospital Stay: Admit: 2022-01-20 | Discharge: 2022-01-20 | Payer: BLUE CROSS/BLUE SHIELD | Primary: Family

## 2022-01-20 DIAGNOSIS — Z1231 Encounter for screening mammogram for malignant neoplasm of breast: Secondary | ICD-10-CM

## 2022-01-29 ENCOUNTER — Ambulatory Visit
Admission: RE | Admit: 2022-01-29 | Discharge: 2022-01-29 | Disposition: A | Discharge status: Home / Self Care | Payer: Managed Care, Other (non HMO) | Source: Ambulatory Visit | Attending: Infectious Diseases | Admitting: Infectious Diseases

## 2022-01-29 ENCOUNTER — Other Ambulatory Visit: Payer: Self-pay

## 2022-01-29 DIAGNOSIS — Z1231 Encounter for screening mammogram for malignant neoplasm of breast: Secondary | ICD-10-CM | POA: Insufficient documentation

## 2022-02-02 ENCOUNTER — Encounter: Admit: 2022-02-02 | Payer: PRIVATE HEALTH INSURANCE | Attending: Family | Primary: Family

## 2022-02-02 ENCOUNTER — Other Ambulatory Visit: Payer: Self-pay | Admitting: Infectious Diseases

## 2022-02-02 DIAGNOSIS — N6489 Other specified disorders of breast: Secondary | ICD-10-CM

## 2022-02-02 DIAGNOSIS — R928 Other abnormal and inconclusive findings on diagnostic imaging of breast: Secondary | ICD-10-CM

## 2022-02-02 MED ORDER — FENOFIBRIC ACID (CHOLINE) 135 MG CAPSULE,DELAYED RELEASE
135 | ORAL_CAPSULE | Freq: Every day | ORAL | 1 refills | 90.00000 days | Status: AC
Start: 2022-02-02 — End: 2022-04-10

## 2022-02-07 ENCOUNTER — Ambulatory Visit
Admission: RE | Admit: 2022-02-07 | Discharge: 2022-02-07 | Disposition: A | Discharge status: Home / Self Care | Payer: Managed Care, Other (non HMO) | Source: Ambulatory Visit | Attending: Infectious Diseases | Admitting: Infectious Diseases

## 2022-02-07 ENCOUNTER — Other Ambulatory Visit: Payer: Self-pay

## 2022-02-07 DIAGNOSIS — N6489 Other specified disorders of breast: Secondary | ICD-10-CM

## 2022-02-07 DIAGNOSIS — R928 Other abnormal and inconclusive findings on diagnostic imaging of breast: Secondary | ICD-10-CM | POA: Diagnosis not present

## 2022-02-27 ENCOUNTER — Telehealth: Admit: 2022-02-27 | Payer: PRIVATE HEALTH INSURANCE | Attending: Pulmonary Disease | Primary: Family

## 2022-02-27 ENCOUNTER — Encounter: Admit: 2022-02-27 | Payer: Medicare (Managed Care) | Attending: Pulmonary Disease | Primary: Family

## 2022-02-27 ENCOUNTER — Encounter: Admit: 2022-02-27 | Payer: PRIVATE HEALTH INSURANCE | Attending: Pulmonary Disease | Primary: Family

## 2022-03-28 ENCOUNTER — Encounter: Payer: Self-pay | Admitting: Podiatry

## 2022-03-28 ENCOUNTER — Other Ambulatory Visit: Payer: Self-pay

## 2022-03-28 ENCOUNTER — Ambulatory Visit (INDEPENDENT_AMBULATORY_CARE_PROVIDER_SITE_OTHER): Payer: Managed Care, Other (non HMO)

## 2022-03-28 ENCOUNTER — Ambulatory Visit (INDEPENDENT_AMBULATORY_CARE_PROVIDER_SITE_OTHER): Payer: Managed Care, Other (non HMO) | Admitting: Podiatry

## 2022-03-28 DIAGNOSIS — M7661 Achilles tendinitis, right leg: Secondary | ICD-10-CM | POA: Diagnosis not present

## 2022-03-28 DIAGNOSIS — M21861 Other specified acquired deformities of right lower leg: Secondary | ICD-10-CM

## 2022-03-28 DIAGNOSIS — M9260 Juvenile osteochondrosis of tarsus, unspecified ankle: Secondary | ICD-10-CM | POA: Diagnosis not present

## 2022-03-28 MED ORDER — METHYLPREDNISOLONE 4 MG PO TBPK: ORAL_TABLET | ORAL | 0 refills | Status: AC

## 2022-03-28 MED ORDER — MELOXICAM 15 MG PO TABS: 15.0000 mg | ORAL_TABLET | Freq: Every day | ORAL | 3 refills | Status: DC

## 2022-03-28 NOTE — Patient Instructions (Addendum)
Look for Voltaren gel at the pharmacy over the counter or online (also known as diclofenac 1% gel). Apply to the painful areas 3-4x daily with the supplied dosing card. Allow to dry for 10 minutes before going into socks/shoes ? ? ? ? ?Achilles Tendinitis  ?with Rehab ?Achilles tendinitis is a disorder of the Achilles tendon. The Achilles tendon connects the large calf muscles (Gastrocnemius and Soleus) to the heel bone (calcaneus). This tendon is sometimes called the heel cord. It is important for pushing-off and standing on your toes and is important for walking, running, or jumping. Tendinitis is often caused by overuse and repetitive microtrauma. ?SYMPTOMS ?Pain, tenderness, swelling, warmth, and redness may occur over the Achilles tendon even at rest. ?Pain with pushing off, or flexing or extending the ankle. ?Pain that is worsened after or during activity. ?CAUSES  ?Overuse sometimes seen with rapid increase in exercise programs or in sports requiring running and jumping. ?Poor physical conditioning (strength and flexibility or endurance). ?Running sports, especially training running down hills. ?Inadequate warm-up before practice or play or failure to stretch before participation. ?Injury to the tendon. ?PREVENTION  ?Warm up and stretch before practice or competition. ?Allow time for adequate rest and recovery between practices and competition. ?Keep up conditioning. ?Keep up ankle and leg flexibility. ?Improve or keep muscle strength and endurance. ?Improve cardiovascular fitness. ?Use proper technique. ?Use proper equipment (shoes, skates). ?To help prevent recurrence, taping, protective strapping, or an adhesive bandage may be recommended for several weeks after healing is complete. ?PROGNOSIS  ?Recovery may take weeks to several months to heal. ?Longer recovery is expected if symptoms have been prolonged. ?Recovery is usually quicker if the inflammation is due to a direct blow as compared with overuse or  sudden strain. ?RELATED COMPLICATIONS  ?Healing time will be prolonged if the condition is not correctly treated. The injury must be given plenty of time to heal. ?Symptoms can reoccur if activity is resumed too soon. ?Untreated, tendinitis may increase the risk of tendon rupture requiring additional time for recovery and possibly surgery. ?TREATMENT  ?The first treatment consists of rest anti-inflammatory medication, and ice to relieve the pain. ?Stretching and strengthening exercises after resolution of pain will likely help reduce the risk of recurrence. Referral to a physical therapist or athletic trainer for further evaluation and treatment may be helpful. ?A walking boot or cast may be recommended to rest the Achilles tendon. This can help break the cycle of inflammation and microtrauma. ?Arch supports (orthotics) may be prescribed or recommended by your caregiver as an adjunct to therapy and rest. ?Surgery to remove the inflamed tendon lining or degenerated tendon tissue is rarely necessary and has shown less than predictable results. ?MEDICATION  ?Nonsteroidal anti-inflammatory medications, such as aspirin and ibuprofen, may be used for pain and inflammation relief. Do not take within 7 days before surgery. Take these as directed by your caregiver. Contact your caregiver immediately if any bleeding, stomach upset, or signs of allergic reaction occur. Other minor pain relievers, such as acetaminophen, may also be used. ?Pain relievers may be prescribed as necessary by your caregiver. Do not take prescription pain medication for longer than 4 to 7 days. Use only as directed and only as much as you need. ?Cortisone injections are rarely indicated. Cortisone injections may weaken tendons and predispose to rupture. It is better to give the condition more time to heal than to use them. ?HEAT AND COLD ?Cold is used to relieve pain and reduce inflammation for acute and  chronic Achilles tendinitis. Cold should be  applied for 10 to 15 minutes every 2 to 3 hours for inflammation and pain and immediately after any activity that aggravates your symptoms. Use ice packs or an ice massage. ?Heat may be used before performing stretching and strengthening activities prescribed by your caregiver. Use a heat pack or a warm soak. ?SEEK MEDICAL CARE IF: ?Symptoms get worse or do not improve in 2 weeks despite treatment. ?New, unexplained symptoms develop. Drugs used in treatment may produce side effects. ? ?EXERCISES: ? ?RANGE OF MOTION (ROM) AND STRETCHING EXERCISES - Achilles Tendinitis  ?These exercises may help you when beginning to rehabilitate your injury. Your symptoms may resolve with or without further involvement from your physician, physical therapist or athletic trainer. While completing these exercises, remember:  ?Restoring tissue flexibility helps normal motion to return to the joints. This allows healthier, less painful movement and activity. ?An effective stretch should be held for at least 30 seconds. ?A stretch should never be painful. You should only feel a gentle lengthening or release in the stretched tissue. ? ?STRETCH  Gastroc, Standing  ?Place hands on wall. ?Extend right / left leg, keeping the front knee somewhat bent. ?Slightly point your toes inward on your back foot. ?Keeping your right / left heel on the floor and your knee straight, shift your weight toward the wall, not allowing your back to arch. ?You should feel a gentle stretch in the right / left calf. Hold this position for 10 seconds. ?Repeat 3 times. Complete this stretch 2 times per day. ? ?STRETCH  Soleus, Standing  ?Place hands on wall. ?Extend right / left leg, keeping the other knee somewhat bent. ?Slightly point your toes inward on your back foot. ?Keep your right / left heel on the floor, bend your back knee, and slightly shift your weight over the back leg so that you feel a gentle stretch deep in your back calf. ?Hold this position for 10  seconds. ?Repeat 3 times. Complete this stretch 2 times per day. ? ?Google, Standing  ?Note: This exercise can place a lot of stress on your foot and ankle. Please complete this exercise only if specifically instructed by your caregiver.  ?Place the ball of your right / left foot on a step, keeping your other foot firmly on the same step. ?Hold on to the wall or a rail for balance. ?Slowly lift your other foot, allowing your body weight to press your heel down over the edge of the step. ?You should feel a stretch in your right / left calf. ?Hold this position for 10 seconds. ?Repeat this exercise with a slight bend in your knee. ?Repeat 3 times. Complete this stretch 2 times per day.  ? ?STRENGTHENING EXERCISES - Achilles Tendinitis ?These exercises may help you when beginning to rehabilitate your injury. They may resolve your symptoms with or without further involvement from your physician, physical therapist or athletic trainer. While completing these exercises, remember:  ?Muscles can gain both the endurance and the strength needed for everyday activities through controlled exercises. ?Complete these exercises as instructed by your physician, physical therapist or athletic trainer. Progress the resistance and repetitions only as guided. ?You may experience muscle soreness or fatigue, but the pain or discomfort you are trying to eliminate should never worsen during these exercises. If this pain does worsen, stop and make certain you are following the directions exactly. If the pain is still present after adjustments, discontinue the exercise until you can  discuss the trouble with your clinician. ? ?STRENGTH - Plantar-flexors  ?Sit with your right / left leg extended. Holding onto both ends of a rubber exercise band/tubing, loop it around the ball of your foot. Keep a slight tension in the band. ?Slowly push your toes away from you, pointing them downward. ?Hold this position for 10 seconds. Return  slowly, controlling the tension in the band/tubing. ?Repeat 3 times. Complete this exercise 2 times per day.  ? ?STRENGTH - Plantar-flexors  ?Stand with your feet shoulder width apart. Steady yourself wi

## 2022-03-30 ENCOUNTER — Encounter: Payer: Self-pay | Admitting: Podiatry

## 2022-03-30 NOTE — Progress Notes (Signed)
?  Subjective:  ?Patient ID: Taylor Dodson, female    DOB: 1956/12/17,  MRN: [id number] ? ?Chief Complaint  ?Patient presents with  ? Foot Pain  ?  Patient presents today for tender "knot" on back of left heel x 1 month  ? ? ?66 y.o. female presents with the above complaint. History confirmed with patient.  Its been worsening over the last month despite icing and stretching.  Tender to touch.  There is a large lump there.  She had a recent right hip replacement and has been doing rehab for this, her therapist wonders if maybe overloading the left side has made this worse while she rehabs the right hip. ? ?Objective:  ?Physical Exam: ?warm, good capillary refill, no trophic changes or ulcerative lesions, normal DP and PT pulses, and normal sensory exam. ?Left Foot:  ? ?Radiographs: ?Multiple views x-ray of the left foot: posterior calcaneal spur, Haglund deformity noted, and appears the posterior spur possibly could have fractured from the main body of the Achilles ?Assessment:  ? ?1. Achilles tendinitis of right lower extremity   ?2. Gastrocnemius equinus of right lower extremity   ?3. Haglund's deformity   ? ? ? ?Plan:  ?Patient was evaluated and treated and all questions answered. ? ?Discussed the etiology and treatment options for Achilles tendinitis including stretching, formal physical therapy with an eccentric exercises therapy plan, supportive shoegears such as a running shoe or sneaker, heel lifts, topical and oral medications.  We also discussed that I do not routinely perform injections in this area because of the risk of an increased damage or rupture of the tendon.  We also discussed the role of surgical treatment of this for patients who do not improve after exhausting non-surgical treatment options. ? ?-XR reviewed with patient ?-Educated on stretching and icing of the affected limb. ?-Referral placed to physical therapy. ?-Rx for Medrol and Meloxicam. Advised to start Meloxicam the day after  completion of oral steroid taper. ?--We discussed putting her into a short cam boot to offload and protect the Achilles tendon, however I am concerned that this may negatively impact her right hip rehab.  I dispensed felt heel lifts today as well as a silicone Achilles pad.  She will have her physical therapist work with her on the Achilles as well. ? ? ?Return in about 1 month (around 04/28/2022).  ? ?

## 2022-04-10 ENCOUNTER — Encounter: Admit: 2022-04-10 | Payer: PRIVATE HEALTH INSURANCE | Attending: Family | Primary: Family

## 2022-04-10 MED ORDER — FENOFIBRIC ACID (CHOLINE) 135 MG CAPSULE,DELAYED RELEASE
135 | ORAL_CAPSULE | Freq: Every day | ORAL | 5 refills | 90.00000 days | Status: AC
Start: 2022-04-10 — End: 2023-05-13

## 2022-04-10 MED ORDER — LOVASTATIN 20 MG TABLET
20 | ORAL_TABLET | Freq: Every evening | ORAL | 5 refills | 90.00000 days | Status: AC
Start: 2022-04-10 — End: 2023-05-13

## 2022-04-10 MED ORDER — ALLOPURINOL 100 MG TABLET
100 | ORAL_TABLET | Freq: Every day | ORAL | 5 refills | 90.00000 days | Status: AC
Start: 2022-04-10 — End: 2023-05-13

## 2022-04-24 ENCOUNTER — Encounter: Admit: 2022-04-24 | Payer: PRIVATE HEALTH INSURANCE | Attending: Family | Primary: Family

## 2022-04-24 MED ORDER — CANDESARTAN 16 MG TABLET
16 | ORAL_TABLET | Freq: Every day | ORAL | 5 refills | 60.00000 days | Status: AC
Start: 2022-04-24 — End: 2023-05-13

## 2022-05-02 ENCOUNTER — Ambulatory Visit (INDEPENDENT_AMBULATORY_CARE_PROVIDER_SITE_OTHER): Payer: Managed Care, Other (non HMO) | Admitting: Podiatry

## 2022-05-02 DIAGNOSIS — M7662 Achilles tendinitis, left leg: Secondary | ICD-10-CM | POA: Diagnosis not present

## 2022-05-02 DIAGNOSIS — M21862 Other specified acquired deformities of left lower leg: Secondary | ICD-10-CM | POA: Diagnosis not present

## 2022-05-02 DIAGNOSIS — M9260 Juvenile osteochondrosis of tarsus, unspecified ankle: Secondary | ICD-10-CM

## 2022-05-06 ENCOUNTER — Encounter: Payer: Self-pay | Admitting: Podiatry

## 2022-05-06 NOTE — Progress Notes (Signed)
?  Subjective:  ?Patient ID: Taylor Dodson, female    DOB: [birth year],  MRN: [id number] ? ?Chief Complaint  ?Patient presents with  ? Tendonitis  ?  Right Achilles, 4 week follow up  ? ? ?66 y.o. female presents with the above complaint. History confirmed with patient.  Its been worsening over the last month despite icing and stretching.  Tender to touch.  There is a large lump there.  She had a recent right hip replacement and has been doing rehab for this, her therapist wonders if maybe overloading the left side has made this worse while she rehabs the right hip. ? ?Interval history: ?She has been doing physical therapy on the Achilles.  She has been taking Medrol and meloxicam as well and this was helpful. ? ?Objective:  ?Physical Exam: ?warm, good capillary refill, no trophic changes or ulcerative lesions, normal DP and PT pulses, and normal sensory exam. ?Left Foot: Pain with patient at the insertion of the Achilles tendon and somewhat in the mid substance, gastrocnemius equinus ? ?Radiographs: ?Multiple views x-ray of the left foot: posterior calcaneal spur, Haglund deformity noted, and appears the posterior spur possibly could have fractured from the main body of the Achilles ?Assessment:  ? ?No diagnosis found. ? ? ? ?Plan:  ?Patient was evaluated and treated and all questions answered. ? ?Discussed the etiology and treatment options for Achilles tendinitis including stretching, formal physical therapy with an eccentric exercises therapy plan, supportive shoegears such as a running shoe or sneaker, heel lifts, topical and oral medications.  We also discussed that I do not routinely perform injections in this area because of the risk of an increased damage or rupture of the tendon.  We also discussed the role of surgical treatment of this for patients who do not improve after exhausting non-surgical treatment options. ? ?Has had some improvement.  I recommend she continue physical therapy.  We also discussed  using Voltaren 3 times daily.  If no improvement by next visit then I would recommend an MRI. ? ? ?Return in about 6 weeks (around 06/13/2022).  ? ?

## 2022-06-13 ENCOUNTER — Ambulatory Visit (INDEPENDENT_AMBULATORY_CARE_PROVIDER_SITE_OTHER): Payer: Managed Care, Other (non HMO) | Admitting: Podiatry

## 2022-06-13 DIAGNOSIS — M7662 Achilles tendinitis, left leg: Secondary | ICD-10-CM

## 2022-06-16 ENCOUNTER — Encounter: Payer: Self-pay | Admitting: Podiatry

## 2022-06-16 NOTE — Progress Notes (Signed)
  Subjective:  Patient ID: Taylor Dodson, female    DOB: Mar 12, 1956,  MRN: [id number]  Chief Complaint  Patient presents with   Tendonitis    Bilateral feet, 6 week follow up    66 y.o. female presents with the above complaint. History confirmed with patient.  Its been worsening over the last month despite icing and stretching.  Tender to touch.  There is a large lump there.  She had a recent right hip replacement and has been doing rehab for this, her therapist wonders if maybe overloading the left side has made this worse while she rehabs the right hip.  Interval history: Doing very well feels it is nearly 100% now  Objective:  Physical Exam: warm, good capillary refill, no trophic changes or ulcerative lesions, normal DP and PT pulses, and normal sensory exam. Left Foot: Little to no pain in the Achilles tendon and midsubstance, equinus is improved  Radiographs: Multiple views x-ray of the left foot: posterior calcaneal spur, Haglund deformity noted, and appears the posterior spur possibly could have fractured from the main body of the Achilles Assessment:   1. Achilles tendinitis of left lower extremity       Plan:  Patient was evaluated and treated and all questions answered.  Overall has had quite a bit of improvement is nearly 100%.  I recommend she continue her home therapy exercise until she is completely pain-free and to resume this if this recurs.  She will return to see me as needed.   Return if symptoms worsen or fail to improve.

## 2022-08-27 ENCOUNTER — Ambulatory Visit: Payer: Managed Care, Other (non HMO) | Admitting: Dermatology

## 2022-09-26 ENCOUNTER — Encounter: Payer: Self-pay | Admitting: Dermatology

## 2022-09-26 ENCOUNTER — Ambulatory Visit: Payer: Managed Care, Other (non HMO) | Admitting: Dermatology

## 2022-09-26 DIAGNOSIS — L578 Other skin changes due to chronic exposure to nonionizing radiation: Secondary | ICD-10-CM

## 2022-09-26 DIAGNOSIS — Z1283 Encounter for screening for malignant neoplasm of skin: Secondary | ICD-10-CM | POA: Diagnosis not present

## 2022-09-26 DIAGNOSIS — D1801 Hemangioma of skin and subcutaneous tissue: Secondary | ICD-10-CM

## 2022-09-26 DIAGNOSIS — L814 Other melanin hyperpigmentation: Secondary | ICD-10-CM | POA: Diagnosis not present

## 2022-09-26 DIAGNOSIS — D225 Melanocytic nevi of trunk: Secondary | ICD-10-CM | POA: Diagnosis not present

## 2022-09-26 DIAGNOSIS — L821 Other seborrheic keratosis: Secondary | ICD-10-CM

## 2022-09-26 DIAGNOSIS — L820 Inflamed seborrheic keratosis: Secondary | ICD-10-CM | POA: Diagnosis not present

## 2022-09-26 DIAGNOSIS — L570 Actinic keratosis: Secondary | ICD-10-CM

## 2022-09-26 DIAGNOSIS — D239 Other benign neoplasm of skin, unspecified: Secondary | ICD-10-CM

## 2022-09-26 DIAGNOSIS — Z85828 Personal history of other malignant neoplasm of skin: Secondary | ICD-10-CM

## 2022-09-26 DIAGNOSIS — D229 Melanocytic nevi, unspecified: Secondary | ICD-10-CM

## 2022-09-26 DIAGNOSIS — D492 Neoplasm of unspecified behavior of bone, soft tissue, and skin: Secondary | ICD-10-CM

## 2022-09-26 HISTORY — DX: Other benign neoplasm of skin, unspecified: D23.9

## 2022-09-26 NOTE — Patient Instructions (Addendum)
Wound Care Instructions  Cleanse wound gently with soap and water once a day then pat dry with clean gauze. Apply a thin coat of Petrolatum (petroleum jelly, "Vaseline") over the wound (unless you have an allergy to this). We recommend that you use a new, sterile tube of Vaseline. Do not pick or remove scabs. Do not remove the yellow or white "healing tissue" from the base of the wound.  Cover the wound with fresh, clean, nonstick gauze and secure with paper tape. You may use Band-Aids in place of gauze and tape if the wound is small enough, but would recommend trimming much of the tape off as there is often too much. Sometimes Band-Aids can irritate the skin.  You should call the office for your biopsy report after 1 week if you have not already been contacted.  If you experience any problems, such as abnormal amounts of bleeding, swelling, significant bruising, significant pain, or evidence of infection, please call the office immediately.  FOR ADULT SURGERY PATIENTS: If you need something for pain relief you may take 1 extra strength Tylenol (acetaminophen) AND 2 Ibuprofen ('200mg'$  each) together every 4 hours as needed for pain. (do not take these if you are allergic to them or if you have a reason you should not take them.) Typically, you may only need pain medication for 1 to 3 days.        Cryotherapy Aftercare  Wash gently with soap and water everyday.   Apply Vaseline and Band-Aid daily until healed.   Actinic Keratosis  What is an actinic keratosis? An actinic keratosis (plural: actinic keratoses) is growth on the surface of the skin that usually appears as a red, hard, crusty or scaly bump.   What causes actinic keratoses? Repeated prolonged sun exposure causes skin damage, especially in fair-skinned persons. Sun-damaged skin becomes dry and wrinkled and may form rough, scaly spots called actinic keratoses. These rough spots remain on the skin even though the crust or scale on  top is picked off.   Why treat actinic keratoses? Actinic keratoses are not skin cancers, but because they may sometimes turn cancerous they are called "pre-cancerous". Not all will turn to skin cancer, and it usually takes several years for this to happen. Because it is much easier to treat an actinic keratosis then it is to remove a skin cancer, actinic keratoses should be treated to prevent future skin cancer.   How are actinic keratoses treated? The most common way of treating actinic keratoses is to freeze them with liquid nitrogen. Freezing causes scabbing and shedding of the sun-damaged skin. Healing after a removal usually takes two weeks, depending on the size and location of the keratosis. Hands and legs heal more slowly than the face. The skin's final appearance is usually excellent. There are several topical medications that can be used to treat actinic keratoses. These medications generally have side effects of redness, crusting, and pain. Some are used for a few days, and some for several months before the actinic keratosis is completely gone. Photodynamic therapy is another alternative to freezing actinic keratoses. This treatment is done in a physician's office. A medication is applied to the area of skin with actinic keratoses, and it is allowed to soak in for one or more hours. A special light is then applied to the skin. Side effects include redness, burning, and peeling.  How can you prevent actinic keratoses? Protection from the sun is the best way to prevent actinic keratoses. The use of  proper clothing and sunscreens can prevent the sun damage that leads to an actinic keratosis.  Unfortunately, some sun damage is permanent. Once sun damage has progressed to the point where actinic keratoses develop, new keratoses may appear even without further sun exposure. However, even in skin that is already heavily sun damaged, good sun protection can help reduce the number of actinic keratoses  that will appear.    Due to recent changes in healthcare laws, you may see results of your pathology and/or laboratory studies on MyChart before the doctors have had a chance to review them. We understand that in some cases there may be results that are confusing or concerning to you. Please understand that not all results are received at the same time and often the doctors may need to interpret multiple results in order to provide you with the best plan of care or course of treatment. Therefore, we ask that you please give [place] 2 business days to thoroughly review all your results before contacting the office for clarification. Should we see a critical lab result, you will be contacted sooner.   If You Need Anything After Your Visit  If you have any questions or concerns for your doctor, please call our main line at [phone] and press option 4 to reach your doctor's medical assistant. If no one answers, please leave a voicemail as directed and we will return your call as soon as possible. Messages left after 4 pm will be answered the following business day.   You may also send [place] a message via [place]. We typically respond to MyChart messages within 1-2 business days.  For prescription refills, please ask your pharmacy to contact our office. Our fax number is [phone].  If you have an urgent issue when the clinic is closed that cannot wait until the next business day, you can page your doctor at the number below.    Please note that while we do our best to be available for urgent issues outside of office hours, we are not available 24/7.   If you have an urgent issue and are unable to reach [place], you may choose to seek medical care at your doctor's office, retail clinic, urgent care center, or emergency room.  If you have a medical emergency, please immediately call 911 or go to the emergency department.  Pager Numbers  - Dr. Nehemiah Massed: [phone]  - Dr. Laurence Ferrari: [phone]  - Dr.  Nicole Kindred: [phone]  In the event of inclement weather, please call our main line at [phone] for an update on the status of any delays or closures.  Dermatology Medication Tips: Please keep the boxes that topical medications come in in order to help keep track of the instructions about where and how to use these. Pharmacies typically print the medication instructions only on the boxes and not directly on the medication tubes.   If your medication is too expensive, please contact our office at [phone] option 4 or send [place] a message through [place].   We are unable to tell what your co-pay for medications will be in advance as this is different depending on your insurance coverage. However, we may be able to find a substitute medication at lower cost or fill out paperwork to get insurance to cover a needed medication.   If a prior authorization is required to get your medication covered by your insurance company, please allow [place] 1-2 business days to complete this process.  Drug prices often vary depending on where the  prescription is filled and some pharmacies may offer cheaper prices.  The website www.goodrx.com contains coupons for medications through different pharmacies. The prices here do not account for what the cost may be with help from insurance (it may be cheaper with your insurance), but the website can give you the price if you did not use any insurance.  - You can print the associated coupon and take it with your prescription to the pharmacy.  - You may also stop by our office during regular business hours and pick up a GoodRx coupon card.  - If you need your prescription sent electronically to a different pharmacy, notify our office through [facility] or by phone at [phone] option 4.     Si Usted Necesita Algo Despus de Su Visita  Tambin puede enviarnos un mensaje a travs de Pharmacist, community. Por lo general respondemos a los mensajes de MyChart en el transcurso  de 1 a 2 das hbiles.  Para renovar recetas, por favor pida a su farmacia que se ponga en contacto con nuestra oficina. Harland Dingwall de fax es [place] [phone].  Si tiene un asunto urgente cuando la clnica est cerrada y que no puede esperar hasta el siguiente da hbil, puede llamar/localizar a su doctor(a) al nmero que aparece a continuacin.   Por favor, tenga en cuenta que aunque hacemos todo lo posible para estar disponibles para asuntos urgentes fuera del horario de [place], no estamos disponibles las 24 horas del da, los 7 das de la [place].   Si tiene un problema urgente y no puede comunicarse con nosotros, puede optar por buscar atencin mdica  en el consultorio de su doctor(a), en una clnica privada, en un centro de atencin urgente o en una sala de emergencias.  Si tiene Engineering geologist, por favor llame inmediatamente al 911 o vaya a la sala de emergencias.  Nmeros de bper  - Dr. Nehemiah Massed: [phone]  - Dra. Moye: [phone]  - Dra. Nicole Kindred: [phone]  En caso de inclemencias del [place], por favor llame a Johnsie Kindred principal al [phone] para una actualizacin sobre el [place] de cualquier retraso o cierre.  Consejos para la medicacin en dermatologa: Por favor, guarde las cajas en las que vienen los medicamentos de uso tpico para ayudarle a seguir las instrucciones sobre dnde y cmo usarlos. Las farmacias generalmente imprimen las instrucciones del medicamento slo en las cajas y no directamente en los tubos del [place].   Si su medicamento es muy caro, por favor, pngase en contacto con Zigmund Daniel llamando al [phone] y presione la opcin 4 o envenos un mensaje a travs de Pharmacist, community.   No podemos decirle cul ser su copago por los medicamentos por adelantado ya que esto es diferente dependiendo de la cobertura de su seguro. Sin embargo, es posible que podamos encontrar un medicamento sustituto a Electrical engineer un formulario  para que el seguro cubra el medicamento que se considera necesario.   Si se requiere una autorizacin previa para que su compaa de seguros [place] su medicamento, por favor permtanos de 1 a 2 das hbiles para completar este proceso.  Los precios de los medicamentos varan con frecuencia dependiendo del Environmental consultant de dnde se surte la receta y alguna farmacias pueden ofrecer precios ms baratos.  El sitio web www.goodrx.com tiene cupones para medicamentos de Airline pilot. Los precios aqu no tienen en cuenta lo que podra costar con la ayuda del seguro (puede ser ms barato con su seguro), pero el sitio web  puede darle el precio si no utiliz ningn seguro.  - Puede imprimir el cupn correspondiente y llevarlo con su receta a la farmacia.  - Tambin puede pasar por nuestra oficina durante el horario de atencin regular y recoger una tarjeta de cupones de GoodRx.  - Si necesita que su receta se enve electrnicamente a una farmacia diferente, informe a nuestra oficina a travs de MyChart de [place] o por telfono llamando al [phone] y presione la opcin 4.  

## 2022-09-26 NOTE — Progress Notes (Unsigned)
New Patient Visit  Subjective  Taylor Dodson is a 66 y.o. female who presents for the following: Total body skin exam (Hx of Skin Cancer L upper lip txted at [place]). The patient presents for Total-Body Skin Exam (TBSE) for skin cancer screening and mole check.  The patient has spots, moles and lesions to be evaluated, some may be new or changing and the patient has concerns that these could be cancer.   The following portions of the chart were reviewed this encounter and updated as appropriate:   Tobacco  Allergies  Meds  Problems  Med Hx  Surg Hx  Fam Hx     Review of Systems:  No other skin or systemic complaints except as noted in HPI or Assessment and Plan.  Objective  Well appearing patient in no apparent distress; mood and affect are within normal limits.  A full examination was performed including scalp, head, eyes, ears, nose, lips, neck, chest, axillae, abdomen, back, buttocks, bilateral upper extremities, bilateral lower extremities, hands, feet, fingers, toes, fingernails, and toenails. All findings within normal limits unless otherwise noted below.  L upper lip x 4 (4) Pink scaly macules     R epigastric 0.8cm irregular brown macule     bil hands/arms x 17 (17) Stuck on waxy paps with erythema   Assessment & Plan   History of Skin Cancer  Left upper lip Clear. Observe for recurrence.  Call clinic for new or changing lesions.   Recommend regular skin exams, daily broad-spectrum spf 30+ sunscreen use, and photoprotection.     - L upper lip area txted with [facility]s ~40yr ago at [facility]- Scattered tan macules - Due to sun exposure - Benign-appearing, observe - Recommend daily broad spectrum sunscreen SPF 30+ to sun-exposed areas, reapply every 2 hours as needed. - Call for any changes - upper back  Seborrheic Keratoses - Stuck-on, waxy, tan-brown papules and/or plaques  - Benign-appearing - Discussed benign  etiology and prognosis. - Observe - Call for any changes - arm, trunk  Melanocytic Nevi - Tan-brown and/or pink-flesh-colored symmetric macules and papules - Benign appearing on exam today - Observation - Call clinic for new or changing moles - Recommend daily use of broad spectrum spf 30+ sunscreen to sun-exposed areas.  - trunk  Hemangiomas - Red papules - Discussed benign nature - Observe - Call for any changes - trunk  Actinic Damage - Chronic condition, secondary to cumulative UV/sun exposure - diffuse scaly erythematous macules with underlying dyspigmentation - Recommend daily broad spectrum sunscreen SPF 30+ to sun-exposed areas, reapply every 2 hours as needed.  - Staying in the shade or wearing long sleeves, sun glasses (UVA+UVB protection) and wide brim hats (4-inch brim around the entire circumference of the hat) are also recommended for sun protection.  - Call for new or changing lesions.  Skin cancer screening performed today.   AK (actinic keratosis) (4) L upper lip x 4  Destruction of lesion - L upper lip x 4 Complexity: simple   Destruction method: cryotherapy   Informed consent: discussed and consent obtained   Timeout:  patient name, date of birth, surgical site, and procedure verified Lesion destroyed using liquid nitrogen: Yes   Region frozen until ice ball extended beyond lesion: Yes   Outcome: patient tolerated procedure well with no complications   Post-procedure details: wound care instructions given    Neoplasm of skin R epigastric  Epidermal / dermal shaving  Lesion  diameter (cm):  0.8 Informed consent: discussed and consent obtained   Timeout: patient name, date of birth, surgical site, and procedure verified   Procedure prep:  Patient was prepped and draped in usual sterile fashion Prep type:  Isopropyl alcohol Anesthesia: the lesion was anesthetized in a standard fashion   Anesthetic:  1% lidocaine w/ epinephrine 1-100,000 buffered w/  8.4% NaHCO3 Instrument used: flexible razor blade   Hemostasis achieved with: pressure, aluminum chloride and electrodesiccation   Outcome: patient tolerated procedure well   Post-procedure details: sterile dressing applied and wound care instructions given   Dressing type: bandage and bacitracin    Specimen 1 - Surgical pathology Differential Diagnosis: D48.5 Nevus vs Dysplastic Nevus  Check Margins: yes 0.8cm irregular brown macule  Inflamed seborrheic keratosis (17) bil hands/arms x 17  Symptomatic, irritating, patient would like treated.   Destruction of lesion - bil hands/arms x 17 Complexity: simple   Destruction method: cryotherapy   Informed consent: discussed and consent obtained   Timeout:  patient name, date of birth, surgical site, and procedure verified Lesion destroyed using liquid nitrogen: Yes   Region frozen until ice ball extended beyond lesion: Yes   Outcome: patient tolerated procedure well with no complications   Post-procedure details: wound care instructions given     Return in about 3 months (around 12/26/2022) for recheck AKs L upper lip.  I, Othelia Pulling, RMA, am acting as scribe for Sarina Ser, MD . Documentation: I have reviewed the above documentation for accuracy and completeness, and I agree with the above.  Sarina Ser, MD

## 2022-09-27 ENCOUNTER — Encounter: Payer: Self-pay | Admitting: Dermatology

## 2022-10-01 ENCOUNTER — Telehealth: Payer: Self-pay

## 2022-10-01 NOTE — Telephone Encounter (Signed)
Advised pt of bx result/sh ?

## 2022-10-01 NOTE — Telephone Encounter (Signed)
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

## 2022-10-15 ENCOUNTER — Telehealth: Admit: 2022-10-15 | Payer: PRIVATE HEALTH INSURANCE | Attending: Pulmonary Disease | Primary: Family

## 2022-10-25 ENCOUNTER — Telehealth: Admit: 2022-10-25 | Payer: PRIVATE HEALTH INSURANCE | Attending: Pulmonary Disease | Primary: Family

## 2022-11-16 ENCOUNTER — Telehealth: Admit: 2022-11-16 | Payer: PRIVATE HEALTH INSURANCE | Attending: Pulmonary Disease | Primary: Family

## 2022-11-19 ENCOUNTER — Encounter: Admit: 2022-11-19 | Payer: PRIVATE HEALTH INSURANCE | Attending: Pulmonary Disease | Primary: Family

## 2022-11-27 ENCOUNTER — Inpatient Hospital Stay: Admit: 2022-11-27 | Discharge: 2022-11-27 | Payer: PRIVATE HEALTH INSURANCE | Primary: Family

## 2022-12-07 ENCOUNTER — Encounter: Admit: 2022-12-07 | Payer: PRIVATE HEALTH INSURANCE | Attending: Pulmonary Disease | Primary: Family

## 2022-12-07 ENCOUNTER — Telehealth: Admit: 2022-12-07 | Payer: PRIVATE HEALTH INSURANCE | Attending: Pulmonary Disease | Primary: Family

## 2022-12-07 ENCOUNTER — Encounter: Admit: 2022-12-07 | Payer: Medicare (Managed Care) | Attending: Pulmonary Disease | Primary: Family

## 2022-12-11 ENCOUNTER — Telehealth: Admit: 2022-12-11 | Payer: PRIVATE HEALTH INSURANCE | Attending: Pulmonary Disease | Primary: Family

## 2022-12-13 ENCOUNTER — Ambulatory Visit: Admit: 2022-12-13 | Payer: Medicare (Managed Care) | Attending: Pulmonary Disease | Primary: Family

## 2023-01-02 ENCOUNTER — Ambulatory Visit: Payer: Managed Care, Other (non HMO) | Admitting: Dermatology

## 2023-01-03 ENCOUNTER — Encounter: Admit: 2023-01-03 | Payer: PRIVATE HEALTH INSURANCE | Attending: Family | Primary: Family

## 2023-01-03 ENCOUNTER — Ambulatory Visit: Admit: 2023-01-03 | Payer: Medicare (Managed Care) | Attending: Family | Primary: Family

## 2023-01-03 ENCOUNTER — Inpatient Hospital Stay: Admit: 2023-01-03 | Discharge: 2023-01-03 | Payer: PRIVATE HEALTH INSURANCE | Primary: Family

## 2023-01-03 ENCOUNTER — Ambulatory Visit: Admit: 2023-01-03 | Payer: PRIVATE HEALTH INSURANCE | Attending: Family | Primary: Family

## 2023-01-03 DIAGNOSIS — Z0000 Encounter for general adult medical examination without abnormal findings: Secondary | ICD-10-CM

## 2023-01-24 ENCOUNTER — Telehealth: Admit: 2023-01-24 | Payer: PRIVATE HEALTH INSURANCE | Attending: Family | Primary: Family

## 2023-01-25 ENCOUNTER — Encounter: Admit: 2023-01-25 | Payer: PRIVATE HEALTH INSURANCE | Attending: Family Medicine | Primary: Family

## 2023-01-25 ENCOUNTER — Inpatient Hospital Stay: Admit: 2023-01-25 | Discharge: 2023-01-25 | Payer: PRIVATE HEALTH INSURANCE | Primary: Family

## 2023-01-25 DIAGNOSIS — Z1231 Encounter for screening mammogram for malignant neoplasm of breast: Secondary | ICD-10-CM

## 2023-02-19 ENCOUNTER — Ambulatory Visit: Admit: 2023-02-19 | Payer: Medicare (Managed Care) | Attending: Pulmonary Disease | Primary: Family

## 2023-02-19 ENCOUNTER — Encounter: Admit: 2023-02-19 | Payer: PRIVATE HEALTH INSURANCE | Attending: Pulmonary Disease | Primary: Family

## 2023-03-01 ENCOUNTER — Other Ambulatory Visit: Payer: Self-pay | Admitting: Infectious Diseases

## 2023-03-01 ENCOUNTER — Encounter: Payer: Self-pay | Admitting: Infectious Diseases

## 2023-03-01 DIAGNOSIS — Z1231 Encounter for screening mammogram for malignant neoplasm of breast: Secondary | ICD-10-CM

## 2023-03-05 ENCOUNTER — Encounter: Admit: 2023-03-05 | Payer: PRIVATE HEALTH INSURANCE | Attending: Vascular and Interventional Radiology | Primary: Family

## 2023-03-20 ENCOUNTER — Ambulatory Visit
Admission: RE | Admit: 2023-03-20 | Discharge: 2023-03-20 | Disposition: A | Discharge status: Home / Self Care | Payer: Managed Care, Other (non HMO) | Source: Ambulatory Visit | Attending: Infectious Diseases | Admitting: Infectious Diseases

## 2023-03-20 DIAGNOSIS — Z1231 Encounter for screening mammogram for malignant neoplasm of breast: Secondary | ICD-10-CM

## 2023-05-12 ENCOUNTER — Encounter: Admit: 2023-05-12 | Payer: PRIVATE HEALTH INSURANCE | Attending: Family | Primary: Family

## 2023-05-13 MED ORDER — FENOFIBRIC ACID (CHOLINE) 135 MG CAPSULE,DELAYED RELEASE
135 | ORAL_CAPSULE | Freq: Every day | ORAL | 4 refills | 90.00000 days | Status: AC
Start: 2023-05-13 — End: ?

## 2023-05-13 MED ORDER — CANDESARTAN 16 MG TABLET
16 | ORAL_TABLET | Freq: Every day | ORAL | 4 refills | 60.00000 days | Status: AC
Start: 2023-05-13 — End: 2024-03-26

## 2023-05-13 MED ORDER — ALLOPURINOL 100 MG TABLET
100 | ORAL_TABLET | Freq: Every day | ORAL | 4 refills | 90.00000 days | Status: AC
Start: 2023-05-13 — End: 2024-03-26

## 2023-05-13 MED ORDER — LOVASTATIN 20 MG TABLET
20 | ORAL_TABLET | Freq: Every evening | ORAL | 4 refills | 90.00000 days | Status: AC
Start: 2023-05-13 — End: 2024-03-26

## 2023-07-26 ENCOUNTER — Ambulatory Visit: Payer: Managed Care, Other (non HMO)

## 2023-07-26 DIAGNOSIS — K5730 Diverticulosis of large intestine without perforation or abscess without bleeding: Secondary | ICD-10-CM

## 2023-07-26 DIAGNOSIS — Z1211 Encounter for screening for malignant neoplasm of colon: Secondary | ICD-10-CM

## 2024-01-27 ENCOUNTER — Inpatient Hospital Stay: Admit: 2024-01-27 | Discharge: 2024-01-27 | Payer: PRIVATE HEALTH INSURANCE | Primary: Family

## 2024-01-27 ENCOUNTER — Encounter: Admit: 2024-01-27 | Payer: PRIVATE HEALTH INSURANCE | Primary: Family

## 2024-01-27 ENCOUNTER — Encounter: Admit: 2024-01-27 | Payer: PRIVATE HEALTH INSURANCE | Attending: Family Medicine | Primary: Family

## 2024-01-27 VITALS — Ht 67.0 in | Wt 219.0 lb

## 2024-01-27 DIAGNOSIS — E781 Pure hyperglyceridemia: Secondary | ICD-10-CM

## 2024-01-27 DIAGNOSIS — Z973 Presence of spectacles and contact lenses: Secondary | ICD-10-CM

## 2024-01-27 DIAGNOSIS — E785 Hyperlipidemia, unspecified: Secondary | ICD-10-CM

## 2024-01-27 DIAGNOSIS — D242 Benign neoplasm of left breast: Secondary | ICD-10-CM

## 2024-01-27 DIAGNOSIS — N200 Calculus of kidney: Secondary | ICD-10-CM

## 2024-01-27 DIAGNOSIS — R739 Hyperglycemia, unspecified: Secondary | ICD-10-CM

## 2024-01-27 DIAGNOSIS — Z1231 Encounter for screening mammogram for malignant neoplasm of breast: Secondary | ICD-10-CM

## 2024-01-27 DIAGNOSIS — H833X3 Noise effects on inner ear, bilateral: Secondary | ICD-10-CM

## 2024-01-27 DIAGNOSIS — I10 Essential (primary) hypertension: Secondary | ICD-10-CM

## 2024-01-27 DIAGNOSIS — E790 Hyperuricemia without signs of inflammatory arthritis and tophaceous disease: Secondary | ICD-10-CM

## 2024-01-30 ENCOUNTER — Encounter: Admit: 2024-01-30 | Payer: PRIVATE HEALTH INSURANCE | Attending: Family | Primary: Family

## 2024-02-17 ENCOUNTER — Encounter: Admit: 2024-02-17 | Payer: PRIVATE HEALTH INSURANCE | Primary: Family

## 2024-02-17 ENCOUNTER — Encounter: Admit: 2024-02-17 | Payer: Medicare (Managed Care) | Attending: Pulmonary Disease | Primary: Family

## 2024-02-20 ENCOUNTER — Encounter: Admit: 2024-02-20 | Payer: Medicare (Managed Care) | Attending: Pulmonary Disease | Primary: Family

## 2024-03-24 ENCOUNTER — Encounter: Admit: 2024-03-24 | Payer: Medicare (Managed Care) | Attending: Family | Primary: Family

## 2024-03-25 ENCOUNTER — Encounter: Admit: 2024-03-25 | Payer: PRIVATE HEALTH INSURANCE | Attending: Adult Health | Primary: Family

## 2024-03-25 DIAGNOSIS — E782 Mixed hyperlipidemia: Secondary | ICD-10-CM

## 2024-03-25 DIAGNOSIS — E790 Hyperuricemia without signs of inflammatory arthritis and tophaceous disease: Secondary | ICD-10-CM

## 2024-03-25 DIAGNOSIS — I10 Essential (primary) hypertension: Secondary | ICD-10-CM

## 2024-03-26 MED ORDER — ALLOPURINOL 100 MG TABLET
100 | ORAL_TABLET | Freq: Every day | ORAL | 4 refills | 90.00000 days | Status: AC
Start: 2024-03-26 — End: 2024-05-20

## 2024-03-26 MED ORDER — CANDESARTAN 16 MG TABLET
16 | ORAL_TABLET | Freq: Every day | ORAL | 4 refills | 90.00000 days | Status: AC
Start: 2024-03-26 — End: 2024-05-20

## 2024-03-26 MED ORDER — LOVASTATIN 20 MG TABLET
20 | ORAL_TABLET | Freq: Every evening | ORAL | 4 refills | 90.00000 days | Status: AC
Start: 2024-03-26 — End: 2024-05-20

## 2024-04-16 ENCOUNTER — Ambulatory Visit: Admit: 2024-04-16 | Payer: Medicare (Managed Care) | Attending: Pulmonary Disease | Primary: Family

## 2024-04-16 ENCOUNTER — Encounter: Admit: 2024-04-16 | Payer: PRIVATE HEALTH INSURANCE | Attending: Pulmonary Disease | Primary: Family

## 2024-04-16 VITALS — BP 120/80 | HR 65 | Resp 16 | Ht 67.0 in | Wt 225.0 lb

## 2024-04-16 DIAGNOSIS — R351 Nocturia: Secondary | ICD-10-CM

## 2024-04-16 DIAGNOSIS — H833X3 Noise effects on inner ear, bilateral: Secondary | ICD-10-CM

## 2024-04-16 DIAGNOSIS — E790 Hyperuricemia without signs of inflammatory arthritis and tophaceous disease: Secondary | ICD-10-CM

## 2024-04-16 DIAGNOSIS — G4733 Obstructive sleep apnea (adult) (pediatric): Secondary | ICD-10-CM

## 2024-04-16 DIAGNOSIS — R739 Hyperglycemia, unspecified: Secondary | ICD-10-CM

## 2024-04-16 DIAGNOSIS — N200 Calculus of kidney: Secondary | ICD-10-CM

## 2024-04-16 DIAGNOSIS — E669 Obesity, unspecified: Secondary | ICD-10-CM

## 2024-04-16 DIAGNOSIS — E781 Pure hyperglyceridemia: Secondary | ICD-10-CM

## 2024-04-16 DIAGNOSIS — D242 Benign neoplasm of left breast: Secondary | ICD-10-CM

## 2024-04-16 DIAGNOSIS — D751 Secondary polycythemia: Secondary | ICD-10-CM

## 2024-04-16 DIAGNOSIS — E785 Hyperlipidemia, unspecified: Secondary | ICD-10-CM

## 2024-04-16 DIAGNOSIS — I10 Essential (primary) hypertension: Secondary | ICD-10-CM

## 2024-04-16 DIAGNOSIS — Z973 Presence of spectacles and contact lenses: Secondary | ICD-10-CM

## 2024-04-22 ENCOUNTER — Encounter: Admit: 2024-04-22 | Payer: Medicare (Managed Care) | Attending: Family | Primary: Family

## 2024-05-19 ENCOUNTER — Inpatient Hospital Stay: Admit: 2024-05-19 | Discharge: 2024-05-19 | Payer: Medicare (Managed Care) | Primary: Family

## 2024-05-19 ENCOUNTER — Encounter: Admit: 2024-05-19 | Payer: PRIVATE HEALTH INSURANCE | Attending: Family | Primary: Family

## 2024-05-19 DIAGNOSIS — E790 Hyperuricemia without signs of inflammatory arthritis and tophaceous disease: Secondary | ICD-10-CM

## 2024-05-19 DIAGNOSIS — E781 Pure hyperglyceridemia: Secondary | ICD-10-CM

## 2024-05-19 DIAGNOSIS — R739 Hyperglycemia, unspecified: Secondary | ICD-10-CM

## 2024-05-19 DIAGNOSIS — D751 Secondary polycythemia: Secondary | ICD-10-CM

## 2024-05-19 DIAGNOSIS — Z0000 Encounter for general adult medical examination without abnormal findings: Principal | ICD-10-CM

## 2024-05-19 DIAGNOSIS — I10 Essential (primary) hypertension: Secondary | ICD-10-CM

## 2024-05-19 DIAGNOSIS — E785 Hyperlipidemia, unspecified: Secondary | ICD-10-CM

## 2024-05-19 LAB — CBC WITH AUTO DIFFERENTIAL
BKR WAM ABSOLUTE IMMATURE GRANULOCYTES.: 0.05 x 1000/ÂµL (ref 0.00–0.30)
BKR WAM ABSOLUTE LYMPHOCYTE COUNT.: 1.91 x 1000/ÂµL (ref 0.60–3.70)
BKR WAM ABSOLUTE NRBC: 0 x 1000/ÂµL (ref 0.00–1.00)
BKR WAM ANC (ABSOLUTE NEUTROPHIL COUNT): 6.3 x 1000/ÂµL (ref 2.00–7.60)
BKR WAM BASOPHIL ABSOLUTE COUNT.: 0.07 x 1000/ÂµL (ref 0.00–1.00)
BKR WAM BASOPHILS: 0.8 % (ref 0.0–1.4)
BKR WAM EOSINOPHIL ABSOLUTE COUNT.: 0.24 x 1000/ÂµL (ref 0.00–1.00)
BKR WAM EOSINOPHILS: 2.6 % (ref 0.0–5.0)
BKR WAM HEMATOCRIT: 47.1 % — ABNORMAL HIGH (ref 35.00–45.00)
BKR WAM HEMOGLOBIN: 16 g/dL — ABNORMAL HIGH (ref 11.7–15.5)
BKR WAM IMMATURE GRANULOCYTES: 0.5 % (ref 0.0–1.0)
BKR WAM LYMPHOCYTES: 20.7 % (ref 17.0–50.0)
BKR WAM MCH: 31.2 pg (ref 27.0–33.0)
BKR WAM MCHC: 34 g/dL (ref 31.0–36.0)
BKR WAM MCV: 91.8 fL (ref 80.0–100.0)
BKR WAM MONOCYTE ABSOLUTE COUNT.: 0.64 x 1000/ÂµL (ref 0.00–1.00)
BKR WAM MONOCYTES: 6.9 % (ref 4.0–12.0)
BKR WAM MPV: 10.9 fL (ref 8.0–12.0)
BKR WAM NEUTROPHILS: 68.5 % (ref 39.0–72.0)
BKR WAM NUCLEATED RED BLOOD CELLS: 0 % (ref 0.0–1.0)
BKR WAM PLATELETS: 282 x1000/ÂµL (ref 150–420)
BKR WAM RDW-CV: 13 % (ref 11.0–15.0)
BKR WAM RED BLOOD CELL COUNT.: 5.13 M/ÂµL (ref 4.00–6.00)
BKR WAM WHITE BLOOD CELL COUNT: 9.2 x1000/ÂµL (ref 4.0–11.0)

## 2024-05-19 LAB — LIPID PANEL
BKR CHOLESTEROL/HDL RATIO: 5.6 — ABNORMAL HIGH (ref 0.0–5.0)
BKR CHOLESTEROL: 162 mg/dL
BKR HDL CHOLESTEROL: 29 mg/dL — ABNORMAL LOW (ref >=40)
BKR LDL CHOLESTEROL SAMPSON CALCULATED: 64 mg/dL
BKR TRIGLYCERIDES: 448 mg/dL — ABNORMAL HIGH

## 2024-05-19 LAB — COMPREHENSIVE METABOLIC PANEL
BKR A/G RATIO: 1.4 (ref 1.0–2.2)
BKR ALANINE AMINOTRANSFERASE (ALT): 44 U/L — ABNORMAL HIGH (ref 10–35)
BKR ALBUMIN: 4.6 g/dL (ref 3.6–5.1)
BKR ALKALINE PHOSPHATASE: 64 U/L (ref 9–122)
BKR ANION GAP: 12 (ref 7–17)
BKR ASPARTATE AMINOTRANSFERASE (AST): 41 U/L — ABNORMAL HIGH (ref 10–35)
BKR AST/ALT RATIO: 0.9
BKR BILIRUBIN TOTAL: 0.4 mg/dL (ref <=1.2)
BKR BLOOD UREA NITROGEN: 24 mg/dL — ABNORMAL HIGH (ref 8–23)
BKR BUN / CREAT RATIO: 22 (ref 8.0–23.0)
BKR CALCIUM: 9.6 mg/dL (ref 8.8–10.2)
BKR CHLORIDE: 101 mmol/L (ref 98–107)
BKR CO2: 25 mmol/L (ref 20–30)
BKR CREATININE: 1.09 mg/dL (ref 0.40–1.30)
BKR EGFR, CREATININE (CKD-EPI 2021): 56 mL/min/{1.73_m2} — ABNORMAL LOW (ref >=60)
BKR GLOBULIN: 3.3 g/dL (ref 2.0–3.9)
BKR GLUCOSE: 119 mg/dL — ABNORMAL HIGH (ref 70–100)
BKR POTASSIUM: 4.1 mmol/L (ref 3.3–5.3)
BKR PROTEIN TOTAL: 7.9 g/dL (ref 5.9–8.3)
BKR SODIUM: 138 mmol/L (ref 136–144)

## 2024-05-19 LAB — TSH W/REFLEX TO FT4     (BH GH LMW Q YH): BKR THYROID STIMULATING HORMONE: 2.88 u[IU]/mL

## 2024-05-19 LAB — URIC ACID: BKR URIC ACID: 5 mg/dL (ref 2.7–7.3)

## 2024-05-19 LAB — HEMOGLOBIN A1C
BKR ESTIMATED AVERAGE GLUCOSE: 151 mg/dL
BKR HEMOGLOBIN A1C: 6.9 % — ABNORMAL HIGH (ref 4.0–5.6)

## 2024-05-20 ENCOUNTER — Encounter: Admit: 2024-05-20 | Payer: PRIVATE HEALTH INSURANCE | Attending: Vascular and Interventional Radiology | Primary: Family

## 2024-05-20 ENCOUNTER — Other Ambulatory Visit: Payer: Self-pay | Admitting: Infectious Diseases

## 2024-05-20 DIAGNOSIS — Z1231 Encounter for screening mammogram for malignant neoplasm of breast: Secondary | ICD-10-CM

## 2024-05-20 MED ORDER — FENOFIBRIC ACID (CHOLINE) 135 MG CAPSULE,DELAYED RELEASE
135 | ORAL_CAPSULE | Freq: Every day | ORAL | 4 refills | 90.00000 days | Status: AC
Start: 2024-05-20 — End: 2024-05-21

## 2024-05-20 MED ORDER — ALLOPURINOL 100 MG TABLET
100 | ORAL_TABLET | Freq: Every day | ORAL | 4 refills | 90.00000 days | Status: AC
Start: 2024-05-20 — End: 2024-05-21

## 2024-05-20 MED ORDER — LOVASTATIN 20 MG TABLET
20 | ORAL_TABLET | Freq: Every evening | ORAL | 4 refills | 90.00000 days | Status: AC
Start: 2024-05-20 — End: 2024-05-21

## 2024-05-20 MED ORDER — CANDESARTAN 16 MG TABLET
16 | ORAL_TABLET | Freq: Every day | ORAL | 4 refills | 90.00000 days | Status: AC
Start: 2024-05-20 — End: 2024-05-21

## 2024-05-20 MED ORDER — METFORMIN ER 500 MG TABLET,EXTENDED RELEASE 24 HR
500 | ORAL_TABLET | Freq: Two times a day (BID) | ORAL | 4 refills | 90.00000 days | Status: AC
Start: 2024-05-20 — End: 2024-05-21

## 2024-05-20 NOTE — Other
 Reviewed results with patient.  A1c is now up to 6.9, diabetic level.  Called in metformin 500 mg b.i.d. as well as an order for nutrition education for diabetic diet.Triglycerides up from 292 for 50.  Discussed diet and exerciseALT and AST up slightly.GFR unchanged at 56H&H remains slightly higher than normal as they have for the past 5 yearsTSH and uric acid normalPatient to return follow-up in 3 months for diabetes check and education

## 2024-05-21 MED ORDER — FENOFIBRIC ACID (CHOLINE) 135 MG CAPSULE,DELAYED RELEASE
135 | ORAL_CAPSULE | Freq: Every day | ORAL | 4 refills | 90.00000 days | Status: AC
Start: 2024-05-21 — End: ?

## 2024-05-21 MED ORDER — METFORMIN ER 500 MG TABLET,EXTENDED RELEASE 24 HR
500 | ORAL_TABLET | Freq: Two times a day (BID) | ORAL | 4 refills | 90.00000 days | Status: AC
Start: 2024-05-21 — End: 2024-08-06

## 2024-05-21 MED ORDER — LOVASTATIN 20 MG TABLET
20 | ORAL_TABLET | Freq: Every evening | ORAL | 4 refills | 90.00000 days | Status: AC
Start: 2024-05-21 — End: ?

## 2024-05-21 MED ORDER — ALLOPURINOL 100 MG TABLET
100 | ORAL_TABLET | Freq: Every day | ORAL | 4 refills | 90.00000 days | Status: AC
Start: 2024-05-21 — End: ?

## 2024-05-21 MED ORDER — CANDESARTAN 16 MG TABLET
16 | ORAL_TABLET | Freq: Every day | ORAL | 4 refills | 90.00000 days | Status: AC
Start: 2024-05-21 — End: ?

## 2024-06-03 ENCOUNTER — Encounter

## 2024-06-12 ENCOUNTER — Ambulatory Visit
Admission: RE | Admit: 2024-06-12 | Discharge: 2024-06-12 | Disposition: A | Discharge status: Home / Self Care | Source: Ambulatory Visit | Attending: Infectious Diseases | Admitting: Infectious Diseases

## 2024-06-12 DIAGNOSIS — Z1231 Encounter for screening mammogram for malignant neoplasm of breast: Secondary | ICD-10-CM | POA: Diagnosis present

## 2024-08-06 MED ORDER — METFORMIN ER 500 MG TABLET,EXTENDED RELEASE 24 HR
500 | ORAL_TABLET | Freq: Two times a day (BID) | ORAL | 4 refills | 90.00000 days | Status: AC
Start: 2024-08-06 — End: ?

## 2024-09-24 ENCOUNTER — Encounter: Admit: 2024-09-24 | Payer: Medicare (Managed Care) | Primary: Family

## 2024-09-30 ENCOUNTER — Ambulatory Visit

## 2024-09-30 ENCOUNTER — Ambulatory Visit (INDEPENDENT_AMBULATORY_CARE_PROVIDER_SITE_OTHER): Admitting: Podiatry

## 2024-09-30 VITALS — Ht 67.0 in | Wt 171.0 lb

## 2024-09-30 DIAGNOSIS — M7752 Other enthesopathy of left foot: Secondary | ICD-10-CM | POA: Diagnosis not present

## 2024-09-30 DIAGNOSIS — M7662 Achilles tendinitis, left leg: Secondary | ICD-10-CM | POA: Diagnosis not present

## 2024-09-30 DIAGNOSIS — M722 Plantar fascial fibromatosis: Secondary | ICD-10-CM

## 2024-09-30 DIAGNOSIS — M778 Other enthesopathies, not elsewhere classified: Secondary | ICD-10-CM

## 2024-09-30 MED ORDER — MELOXICAM 15 MG PO TABS: 15.0000 mg | ORAL_TABLET | Freq: Every day | ORAL | 3 refills | Status: AC

## 2024-09-30 NOTE — Progress Notes (Signed)
 Subjective:  Patient ID: Taylor Dodson, female    DOB: October 04, 1956,  MRN: [id number]  Chief Complaint  Patient presents with   Foot Pain    Rm 1 Patient is here for left foot heel pain. Pain started in August 2025 after a hiking trip. Pt is using insoles and new orthopedic shoes for relief.    Discussed the use of AI scribe software for clinical note transcription with the patient, who gave verbal consent to proceed.  History of Present Illness Taylor Dodson is a 68 year old female who presents with left heel pain.  Left heel pain began in August 2025 following a hiking trip. The pain is persistent and not improving with current treatments. It was previously experienced in the back of the heel but is now more pronounced on the bottom of the foot, particularly at the medial insertion of the plantar fascia. The pain is described as sharp and severe, especially when stepping, likened to 'a little round pen type thing'.  In the past, she was treated for insertional Achilles tendinitis with home and formal physical therapy in March, May, and June 2023, which was successful at that time. She has been using insoles and orthopedic shoes for relief. Recently, she has been on prednisone  for a jaw infection following a dental visit, and she is also taking antibiotics. The prednisone  has not alleviated the heel pain.  She recalls using a steroid pack, anti-inflammatory medication, and physical therapy in the past for similar issues. She is currently on prednisone . She is concerned about potential side effects of meloxicam , as she experiences lightheadedness and has a history of blood pressure issues.  The patient reports that the pain is now more pronounced on the bottom of the foot and less in the back of the heel.      Objective:    Physical Exam VASCULAR: DP and PT pulse palpable. Foot is warm and well-perfused. Capillary fill time is brisk. DERMATOLOGIC: Normal skin turgor, texture, and  temperature. No open lesions, rashes, or ulcerations. NEUROLOGIC: Normal sensation to light touch and pressure. No paresthesias. ORTHOPEDIC: Mild pain on palpation at Achilles tendon insertion. Severe sharp pain at plantar fasciitis medial insertion. Smooth pain-free range of motion of all examined joints. No ecchymosis or bruising. No gross deformity.   No images are attached to the encounter.    Results RADIOLOGY Foot radiographs: Posterior calcaneal spur, Haglund deformity, inferior calcaneal spur, no fracture or stress fracture (09/30/2024)   Assessment:   1. Achilles tendinitis of left lower extremity   2. Plantar fasciitis of left foot      Plan:  Patient was evaluated and treated and all questions answered.  Assessment and Plan Assessment & Plan Left plantar fasciitis with medial insertion pain and inferior calcaneal spur Chronic left plantar fasciitis with severe sharp pain at the medial insertion, exacerbated by weight-bearing activities. Radiographs show an inferior calcaneal spur. Symptoms persist despite prednisone  treatment. Current presentation aligns more with plantar fasciitis than previous Achilles tendinopathy. Cortisone injections are considered safe and effective for heel pain, unlike for Achilles tendinopathy. 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 Radiographs show a posterior calcaneal spur and Haglund deformity. - Provide home exercises that overlap with plantar fasciitis exercises -  Monitor symptoms and adjust treatment if necessary      Return in about 6 weeks (around 11/11/2024) for recheck plantar fasciitis, re-check Achilles tendon.

## 2024-09-30 NOTE — Patient Instructions (Addendum)
 VISIT SUMMARY: During your visit, we discussed your persistent left heel pain, which began after a hiking trip in August 2025. The pain has shifted from the back of your heel to the bottom of your foot and is particularly severe when stepping. 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  YOUR PLAN: -LEFT PLANTAR FASCIITIS WITH MEDIAL INSERTION PAIN AND INFERIOR CALCANEAL SPUR: Plantar fasciitis is inflammation of the tissue on the bottom of your foot, causing sharp pain, especially when stepping. We will continue your current prednisone  course and prescribe meloxicam  to start after you finish the prednisone . If meloxicam  is ineffective or causes side effects, we may administer a steroid injection. You will also use a walking boot for 1-2 weeks to rest your foot and perform home exercises for plantar fasciitis. If these measures are not enough, we may refer you to physical therapy.  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 You have a mild pain on palpation and radiographs show a posterior calcaneal spur and Haglund deformity. We will provide home exercises that overlap with those for plantar fasciitis and monitor your symptoms to adjust treatment if necessary.  INSTRUCTIONS: Please follow the prescribed course of prednisone  and start meloxicam  after completing the prednisone . Use the walking boot for 1-2 weeks and perform the home exercises provided. If your pain does not improve or if you experience any side effects, contact us  immediately. We may consider a steroid injection or refer you to physical therapy if needed.                      Contains text generated by [employer].         Plantar Fasciitis (Heel  Spur Syndrome) with Rehab The plantar fascia is a fibrous, ligament-like, soft-tissue structure that spans the bottom of the foot. Plantar fasciitis is a condition that causes pain in the foot due to inflammation of the tissue. SYMPTOMS  Pain and tenderness on the underneath side of the foot. Pain that worsens with standing or walking. CAUSES  Plantar fasciitis is caused by irritation and injury to the plantar fascia on the underneath side of the foot. Common mechanisms of injury include: Direct trauma to bottom of the foot. Damage to a small nerve that runs under the foot where the main fascia attaches to the heel bone. Stress placed on the plantar fascia due to bone spurs. RISK INCREASES WITH:  Activities that place stress on the plantar fascia (running, jumping, pivoting, or cutting). Poor strength and flexibility. Improperly fitted shoes. Tight calf muscles. Flat feet. Failure to warm-up properly before activity. Obesity. PREVENTION Warm up and stretch properly before activity. Allow for adequate recovery between workouts. Maintain physical fitness: Strength, flexibility, and endurance. Cardiovascular fitness. Maintain a health body weight. Avoid stress on the plantar fascia. Wear properly fitted shoes, including arch supports for individuals who have flat feet.  PROGNOSIS  If treated properly, then the symptoms of plantar fasciitis usually resolve without surgery. However, occasionally surgery is necessary.  RELATED COMPLICATIONS  Recurrent symptoms that may result in a chronic condition. Problems of the lower back that are caused by compensating for the injury, such as limping. Pain or weakness of the foot during push-off following surgery. Chronic inflammation, scarring, and partial or complete fascia tear, occurring more often  from repeated injections.  TREATMENT  Treatment initially involves the use of ice and medication to help reduce pain and inflammation. The use of  strengthening and stretching exercises may help reduce pain with activity, especially stretches of the Achilles tendon. These exercises may be performed at home or with a therapist. Your caregiver may recommend that you use heel cups of arch supports to help reduce stress on the plantar fascia. Occasionally, corticosteroid injections are given to reduce inflammation. If symptoms persist for greater than 6 months despite non-surgical (conservative), then surgery may be recommended.   MEDICATION  If pain medication is necessary, then nonsteroidal anti-inflammatory medications, such as aspirin and ibuprofen , or other minor pain relievers, such as acetaminophen , are often recommended. Do not take pain medication within 7 days before surgery. Prescription pain relievers may be given if deemed necessary by your caregiver. Use only as directed and only as much as you need. Corticosteroid injections may be given by your caregiver. These injections should be reserved for the most serious cases, because they may only be given a certain number of times.  HEAT AND COLD Cold treatment (icing) relieves pain and reduces inflammation. Cold treatment should be applied for 10 to 15 minutes every 2 to 3 hours for inflammation and pain and immediately after any activity that aggravates your symptoms. Use ice packs or massage the area with a piece of ice (ice massage). Heat treatment may be used prior to performing the stretching and strengthening activities prescribed by your caregiver, physical therapist, or athletic trainer. Use a heat pack or soak the injury in warm water.  SEEK IMMEDIATE MEDICAL CARE IF: Treatment seems to offer no benefit, or the condition worsens. Any medications produce adverse side effects.  EXERCISES- RANGE OF MOTION (ROM) AND STRETCHING EXERCISES - Plantar Fasciitis (Heel Spur Syndrome) These exercises may help you when beginning to rehabilitate your injury. Your symptoms may resolve with or  without further involvement from your physician, physical therapist or athletic trainer. While completing these exercises, remember:  Restoring tissue flexibility helps normal motion to return to the joints. This allows healthier, less painful movement and activity. An effective stretch should be held for at least 30 seconds. A stretch should never be painful. You should only feel a gentle lengthening or release in the stretched tissue.  RANGE OF MOTION - Toe Extension, Flexion Sit with your right / left leg crossed over your opposite knee. Grasp your toes and gently pull them back toward the top of your foot. You should feel a stretch on the bottom of your toes and/or foot. Hold this stretch for 10 seconds. Now, gently pull your toes toward the bottom of your foot. You should feel a stretch on the top of your toes and or foot. Hold this stretch for 10 seconds. Repeat  times. Complete this stretch 3 times per day.   RANGE OF MOTION - Ankle Dorsiflexion, Active Assisted Remove shoes and sit on a chair that is preferably not on a carpeted surface. Place right / left foot under knee. Extend your opposite leg for support. Keeping your heel down, slide your right / left foot back toward the chair until you feel a stretch at your ankle or calf. If you do not feel a stretch, slide your bottom forward to the edge of the chair, while still keeping your heel down. Hold this stretch for 10 seconds. Repeat 3 times. Complete this stretch 2 times per day.   STRETCH  Gastroc, Standing Place hands on wall.  Extend right / left leg, keeping the front knee somewhat bent. Slightly point your toes inward on your back foot. Keeping your right / left heel on the floor and your knee straight, shift your weight toward the wall, not allowing your back to arch. You should feel a gentle stretch in the right / left calf. Hold this position for 10 seconds. Repeat 3 times. Complete this stretch 2 times per  day.  STRETCH  Soleus, Standing Place hands on wall. Extend right / left leg, keeping the other knee somewhat bent. Slightly point your toes inward on your back foot. Keep your right / left heel on the floor, bend your back knee, and slightly shift your weight over the back leg so that you feel a gentle stretch deep in your back calf. Hold this position for 10 seconds. Repeat 3 times. Complete this stretch 2 times per day.  STRETCH  Gastrocsoleus, Standing  Note: This exercise can place a lot of stress on your foot and ankle. Please complete this exercise only if specifically instructed by your caregiver.  Place the ball of your right / left foot on a step, keeping your other foot firmly on the same step. Hold on to the wall or a rail for balance. Slowly lift your other foot, allowing your body weight to press your heel down over the edge of the step. You should feel a stretch in your right / left calf. Hold this position for 10 seconds. Repeat this exercise with a slight bend in your right / left knee. Repeat 3 times. Complete this stretch 2 times per day.   STRENGTHENING EXERCISES - Plantar Fasciitis (Heel Spur Syndrome)  These exercises may help you when beginning to rehabilitate your injury. They may resolve your symptoms with or without further involvement from your physician, physical therapist or athletic trainer. While completing these exercises, remember:  Muscles can gain both the endurance and the strength needed for everyday activities through controlled exercises. Complete these exercises as instructed by your physician, physical therapist or athletic trainer. Progress the resistance and repetitions only as guided.  STRENGTH - Towel Curls Sit in a chair positioned on a non-carpeted surface. Place your foot on a towel, keeping your heel on the floor. Pull the towel toward your heel by only curling your toes. Keep your heel on the floor. Repeat 3 times. Complete this exercise  2 times per day.  STRENGTH - Ankle Inversion Secure one end of a rubber exercise band/tubing to a fixed object (table, pole). Loop the other end around your foot just before your toes. Place your fists between your knees. This will focus your strengthening at your ankle. Slowly, pull your big toe up and in, making sure the band/tubing is positioned to resist the entire motion. Hold this position for 10 seconds. Have your muscles resist the band/tubing as it slowly pulls your foot back to the starting position. Repeat 3 times. Complete this exercises 2 times per day.  Document Released: 12/17/2005 Document Revised: 03/10/2012 Document Reviewed: 03/31/2009 [facility] Patient Information 2014 [place], [place].    Achilles Tendinitis  with Rehab Achilles tendinitis is a disorder of the Achilles tendon. The Achilles tendon connects the large calf muscles (Gastrocnemius and Soleus) to the heel bone (calcaneus). This tendon is sometimes called the heel cord. It is important for pushing-off and standing on your toes and is important for walking, running, or jumping. Tendinitis is often caused by overuse and repetitive microtrauma. SYMPTOMS Pain, tenderness, swelling, warmth, and redness  may occur over the Achilles tendon even at rest. Pain with pushing off, or flexing or extending the ankle. Pain that is worsened after or during activity. CAUSES  Overuse sometimes seen with rapid increase in exercise programs or in sports requiring running and jumping. Poor physical conditioning (strength and flexibility or endurance). Running sports, especially training running down hills. Inadequate warm-up before practice or play or failure to stretch before participation. Injury to the tendon. PREVENTION  Warm up and stretch before practice or competition. Allow time for adequate rest and recovery between practices and competition. Keep up conditioning. Keep up ankle and leg flexibility. Improve or keep  muscle strength and endurance. Improve cardiovascular fitness. Use proper technique. Use proper equipment (shoes, skates). To help prevent recurrence, taping, protective strapping, or an adhesive bandage may be recommended for several weeks after healing is complete. PROGNOSIS  Recovery may take weeks to several months to heal. Longer recovery is expected if symptoms have been prolonged. Recovery is usually quicker if the inflammation is due to a direct blow as compared with overuse or sudden strain. RELATED COMPLICATIONS  Healing time will be prolonged if the condition is not correctly treated. The injury must be given plenty of time to heal. Symptoms can reoccur if activity is resumed too soon. Untreated, tendinitis may increase the risk of tendon rupture requiring additional time for recovery and possibly surgery. TREATMENT  The first treatment consists of rest anti-inflammatory medication, and ice to relieve the pain. Stretching and strengthening exercises after resolution of pain will likely help reduce the risk of recurrence. Referral to a physical therapist or athletic trainer for further evaluation and treatment may be helpful. A walking boot or cast may be recommended to rest the Achilles tendon. This can help break the cycle of inflammation and microtrauma. Arch supports (orthotics) may be prescribed or recommended by your caregiver as an adjunct to therapy and rest. Surgery to remove the inflamed tendon lining or degenerated tendon tissue is rarely necessary and has shown less than predictable results. MEDICATION  Nonsteroidal anti-inflammatory medications, such as aspirin and ibuprofen , may be used for pain and inflammation relief. Do not take within 7 days before surgery. Take these as directed by your caregiver. Contact your caregiver immediately if any bleeding, stomach upset, or signs of allergic reaction occur. Other minor pain relievers, such as acetaminophen , may also be  used. Pain relievers may be prescribed as necessary by your caregiver. Do not take prescription pain medication for longer than 4 to 7 days. Use only as directed and only as much as you need. Cortisone injections are rarely indicated. Cortisone injections may weaken tendons and predispose to rupture. It is better to give the condition more time to heal than to use them. HEAT AND COLD Cold is used to relieve pain and reduce inflammation for acute and chronic Achilles tendinitis. Cold should be applied for 10 to 15 minutes every 2 to 3 hours for inflammation and pain and immediately after any activity that aggravates your symptoms. Use ice packs or an ice massage. Heat may be used before performing stretching and strengthening activities prescribed by your caregiver. Use a heat pack or a warm soak. SEEK MEDICAL CARE IF: Symptoms get worse or do not improve in 2 weeks despite treatment. New, unexplained symptoms develop. Drugs used in treatment may produce side effects.  EXERCISES:  RANGE OF MOTION (ROM) AND STRETCHING EXERCISES - Achilles Tendinitis  These exercises may help you when beginning to rehabilitate your injury. Your symptoms may  resolve with or without further involvement from your physician, physical therapist or athletic trainer. While completing these exercises, remember:  Restoring tissue flexibility helps normal motion to return to the joints. This allows healthier, less painful movement and activity. An effective stretch should be held for at least 30 seconds. A stretch should never be painful. You should only feel a gentle lengthening or release in the stretched tissue.  STRETCH  Gastroc, Standing  Place hands on wall. Extend right / left leg, keeping the front knee somewhat bent. Slightly point your toes inward on your back foot. Keeping your right / left heel on the floor and your knee straight, shift your weight toward the wall, not allowing your back to arch. You should  feel a gentle stretch in the right / left calf. Hold this position for 10 seconds. Repeat 3 times. Complete this stretch 2 times per day.  STRETCH  Soleus, Standing  Place hands on wall. Extend right / left leg, keeping the other knee somewhat bent. Slightly point your toes inward on your back foot. Keep your right / left heel on the floor, bend your back knee, and slightly shift your weight over the back leg so that you feel a gentle stretch deep in your back calf. Hold this position for 10 seconds. Repeat 3 times. Complete this stretch 2 times per day.  STRETCH  Gastrocsoleus, Standing  Note: This exercise can place a lot of stress on your foot and ankle. Please complete this exercise only if specifically instructed by your caregiver.  Place the ball of your right / left foot on a step, keeping your other foot firmly on the same step. Hold on to the wall or a rail for balance. Slowly lift your other foot, allowing your body weight to press your heel down over the edge of the step. You should feel a stretch in your right / left calf. Hold this position for 10 seconds. Repeat this exercise with a slight bend in your knee. Repeat 3 times. Complete this stretch 2 times per day.   STRENGTHENING EXERCISES - Achilles Tendinitis These exercises may help you when beginning to rehabilitate your injury. They may resolve your symptoms with or without further involvement from your physician, physical therapist or athletic trainer. While completing these exercises, remember:  Muscles can gain both the endurance and the strength needed for everyday activities through controlled exercises. Complete these exercises as instructed by your physician, physical therapist or athletic trainer. Progress the resistance and repetitions only as guided. You may experience muscle soreness or fatigue, but the pain or discomfort you are trying to eliminate should never worsen during these exercises. If this pain does  worsen, stop and make certain you are following the directions exactly. If the pain is still present after adjustments, discontinue the exercise until you can discuss the trouble with your clinician.  STRENGTH - Plantar-flexors  Sit with your right / left leg extended. Holding onto both ends of a rubber exercise band/tubing, loop it around the ball of your foot. Keep a slight tension in the band. Slowly push your toes away from you, pointing them downward. Hold this position for 10 seconds. Return slowly, controlling the tension in the band/tubing. Repeat 3 times. Complete this exercise 2 times per day.   STRENGTH - Plantar-flexors  Stand with your feet shoulder width apart. Steady yourself with a wall or table using as little support as needed. Keeping your weight evenly spread over the width of your feet, rise  up on your toes.* Hold this position for 10 seconds. Repeat 3 times. Complete this exercise 2 times per day.  *If this is too easy, shift your weight toward your right / left leg until you feel challenged. Ultimately, you may be asked to do this exercise with your right / left foot only.  STRENGTH  Plantar-flexors, Eccentric  Note: This exercise can place a lot of stress on your foot and ankle. Please complete this exercise only if specifically instructed by your caregiver.  Place the balls of your feet on a step. With your hands, use only enough support from a wall or rail to keep your balance. Keep your knees straight and rise up on your toes. Slowly shift your weight entirely to your right / left toes and pick up your opposite foot. Gently and with controlled movement, lower your weight through your right / left foot so that your heel drops below the level of the step. You will feel a slight stretch in the back of your calf at the end position. Use the healthy leg to help rise up onto the balls of both feet, then lower weight only on the right / left leg again. Build up to 15  repetitions. Then progress to 3 consecutive sets of 15 repetitions.* After completing the above exercise, complete the same exercise with a slight knee bend (about 30 degrees). Again, build up to 15 repetitions. Then progress to 3 consecutive sets of 15 repetitions.* Perform this exercise 2 times per day.  *When you easily complete 3 sets of 15, your physician, physical therapist or athletic trainer may advise you to add resistance by wearing a backpack filled with additional weight.  STRENGTH - Plantar Flexors, Seated  Sit on a chair that allows your feet to rest flat on the ground. If necessary, sit at the edge of the chair. Keeping your toes firmly on the ground, lift your right / left heel as far as you can without increasing any discomfort in your ankle. Repeat 3 times. Complete this exercise 2 times a day.

## 2024-11-22 ENCOUNTER — Encounter: Admit: 2024-11-22 | Payer: PRIVATE HEALTH INSURANCE | Primary: Family

## 2024-11-22 DIAGNOSIS — E1100 Type 2 diabetes mellitus with hyperosmolarity without nonketotic hyperglycemic-hyperosmolar coma (NKHHC): Principal | ICD-10-CM

## 2024-11-23 MED ORDER — METFORMIN ER 500 MG TABLET,EXTENDED RELEASE 24 HR
500 | ORAL_TABLET | Freq: Two times a day (BID) | ORAL | 4 refills | 30.00000 days | Status: AC
Start: 2024-11-23 — End: ?

## 2024-12-01 ENCOUNTER — Ambulatory Visit: Admitting: Dermatology

## 2024-12-01 DIAGNOSIS — L814 Other melanin hyperpigmentation: Secondary | ICD-10-CM | POA: Diagnosis not present

## 2024-12-01 DIAGNOSIS — L570 Actinic keratosis: Secondary | ICD-10-CM

## 2024-12-01 DIAGNOSIS — Z85828 Personal history of other malignant neoplasm of skin: Secondary | ICD-10-CM

## 2024-12-01 DIAGNOSIS — C44619 Basal cell carcinoma of skin of left upper limb, including shoulder: Secondary | ICD-10-CM | POA: Diagnosis not present

## 2024-12-01 DIAGNOSIS — Z1283 Encounter for screening for malignant neoplasm of skin: Secondary | ICD-10-CM

## 2024-12-01 DIAGNOSIS — L578 Other skin changes due to chronic exposure to nonionizing radiation: Secondary | ICD-10-CM | POA: Diagnosis not present

## 2024-12-01 DIAGNOSIS — L820 Inflamed seborrheic keratosis: Secondary | ICD-10-CM

## 2024-12-01 DIAGNOSIS — W908XXA Exposure to other nonionizing radiation, initial encounter: Secondary | ICD-10-CM

## 2024-12-01 DIAGNOSIS — L821 Other seborrheic keratosis: Secondary | ICD-10-CM

## 2024-12-01 DIAGNOSIS — L853 Xerosis cutis: Secondary | ICD-10-CM

## 2024-12-01 DIAGNOSIS — S80812A Abrasion, left lower leg, initial encounter: Secondary | ICD-10-CM

## 2024-12-01 DIAGNOSIS — D485 Neoplasm of uncertain behavior of skin: Secondary | ICD-10-CM

## 2024-12-01 DIAGNOSIS — Z86018 Personal history of other benign neoplasm: Secondary | ICD-10-CM

## 2024-12-01 DIAGNOSIS — D225 Melanocytic nevi of trunk: Secondary | ICD-10-CM

## 2024-12-01 DIAGNOSIS — D1801 Hemangioma of skin and subcutaneous tissue: Secondary | ICD-10-CM

## 2024-12-01 DIAGNOSIS — D229 Melanocytic nevi, unspecified: Secondary | ICD-10-CM

## 2024-12-01 NOTE — Patient Instructions (Addendum)
 Cryotherapy Aftercare  Wash gently with soap and water everyday.   Apply Vaseline and Band-Aid daily until healed.   Wound Care Instructions  Cleanse wound gently with soap and water once a day then pat dry with clean gauze. Apply a thin coat of Petrolatum (petroleum jelly, Vaseline) over the wound (unless you have an allergy to this). We recommend that you use a new, sterile tube of Vaseline. Do not pick or remove scabs. Do not remove the yellow or white healing tissue from the base of the wound.  Cover the wound with fresh, clean, nonstick gauze and secure with paper tape. You may use Band-Aids in place of gauze and tape if the wound is small enough, but would recommend trimming much of the tape off as there is often too much. Sometimes Band-Aids can irritate the skin.  You should call the office for your biopsy report after 1 week if you have not already been contacted.  If you experience any problems, such as abnormal amounts of bleeding, swelling, significant bruising, significant pain, or evidence of infection, please call the office immediately.  FOR ADULT SURGERY PATIENTS: If you need something for pain relief you may take 1 extra strength Tylenol  (acetaminophen ) AND 2 Ibuprofen  (200mg  each) together every 4 hours as needed for pain. (do not take these if you are allergic to them or if you have a reason you should not take them.) Typically, you may only need pain medication for 1 to 3 days.     Gentle Skin Care Guide  1. Bathe no more than once a day.  2. Avoid bathing in hot water  3. Use a mild soap like Dove, Vanicream, Cetaphil, CeraVe. Can use Lever 2000 or Cetaphil antibacterial soap  4. Use soap only where you need it. On most days, use it under your arms, between your legs, and on your feet. Let the water rinse other areas unless visibly dirty.  5. When you get out of the bath/shower, use a towel to gently blot your skin dry, don't rub it.  6. While your skin is  still a little damp, apply a moisturizing cream such as Vanicream, CeraVe, Cetaphil, Eucerin, Sarna lotion or plain Vaseline Jelly. For hands apply Neutrogena Norwegian Hand Cream or Excipial Hand Cream.  7. Reapply moisturizer any time you start to itch or feel dry.  8. Sometimes using free and clear laundry detergents can be helpful. Fabric softener sheets should be avoided. Downy Free & Gentle liquid, or any liquid fabric softener that is free of dyes and perfumes, it acceptable to use  9. If your doctor has given you prescription creams you may apply moisturizers over them      Due to recent changes in healthcare laws, you may see results of your pathology and/or laboratory studies on MyChart before the doctors have had a chance to review them. We understand that in some cases there may be results that are confusing or concerning to you. Please understand that not all results are received at the same time and often the doctors may need to interpret multiple results in order to provide you with the best plan of care or course of treatment. Therefore, we ask that you please give us  2 business days to thoroughly review all your results before contacting the office for clarification. Should we see a critical lab result, you will be contacted sooner.   If You Need Anything After Your Visit  If you have any questions or concerns for your  doctor, please call our main line at [phone] and press option 4 to reach your doctor's medical assistant. If no one answers, please leave a voicemail as directed and we will return your call as soon as possible. Messages left after 4 pm will be answered the following business day.   You may also send us  a message via MyChart. We typically respond to MyChart messages within 1-2 business days.  For prescription refills, please ask your pharmacy to contact our office. Our fax number is [phone].  If you have an urgent issue when the clinic is closed that  cannot wait until the next business day, you can page your doctor at the number below.    Please note that while we do our best to be available for urgent issues outside of office hours, we are not available 24/7.   If you have an urgent issue and are unable to reach us , you may choose to seek medical care at your doctor's office, retail clinic, urgent care center, or emergency room.  If you have a medical emergency, please immediately call 911 or go to the emergency department.  Pager Numbers  - Dr. Hester: [phone]  - Dr. Jackquline: [phone]  - Dr. Claudene: [phone]   - Dr. Raymund: [phone]  In the event of inclement weather, please call our main line at [phone] for an update on the status of any delays or closures.  Dermatology Medication Tips: Please keep the boxes that topical medications come in in order to help keep track of the instructions about where and how to use these. Pharmacies typically print the medication instructions only on the boxes and not directly on the medication tubes.   If your medication is too expensive, please contact our office at [phone] option 4 or send us  a message through MyChart.   We are unable to tell what your co-pay for medications will be in advance as this is different depending on your insurance coverage. However, we may be able to find a substitute medication at lower cost or fill out paperwork to get insurance to cover a needed medication.   If a prior authorization is required to get your medication covered by your insurance company, please allow us  1-2 business days to complete this process.  Drug prices often vary depending on where the prescription is filled and some pharmacies may offer cheaper prices.  The website www.goodrx.com contains coupons for medications through different pharmacies. The prices here do not account for what the cost may be with help from insurance (it may be cheaper with your insurance),  but the website can give you the price if you did not use any insurance.  - You can print the associated coupon and take it with your prescription to the pharmacy.  - You may also stop by our office during regular business hours and pick up a GoodRx coupon card.  - If you need your prescription sent electronically to a different pharmacy, notify our office through [facility] or by phone at [phone] option 4.     Si Usted Necesita Algo Despus de Su Visita  Tambin puede enviarnos un mensaje a travs de Clinical Cytogeneticist. Por lo general respondemos a los mensajes de MyChart en el transcurso de 1 a 2 das hbiles.  Para renovar recetas, por favor pida a su farmacia que se ponga en contacto con nuestra oficina. Randi lakes de fax es [place] [phone].  Si tiene un asunto urgente cuando la clnica est cerrada y que  no puede esperar hasta el siguiente da hbil, puede llamar/localizar a su doctor(a) al nmero que aparece a continuacin.   Por favor, tenga en cuenta que aunque hacemos todo lo posible para estar disponibles para asuntos urgentes fuera del horario de [place], no estamos disponibles las 24 horas del da, los 7 [address]  [address] de la [place].   Si tiene un problema urgente y no puede comunicarse con nosotros, puede optar por buscar atencin mdica  en el consultorio de su doctor(a), en una clnica privada, en un centro de atencin urgente o en una sala de emergencias.  Si tiene engineer, drilling, por favor llame inmediatamente al 911 o vaya a la sala de emergencias.  Nmeros de bper  - Dr. Hester: [phone]  - Dra. Jackquline: [phone]  - Dr. Claudene: [phone]  - Dra. Kitts: [phone]  En caso de inclemencias del [place], por favor llame a nuestra lnea principal al [phone] para una actualizacin sobre el estado de cualquier retraso o cierre.  Consejos para la medicacin en dermatologa: Por favor, guarde las cajas en las que vienen los medicamentos de uso tpico  para ayudarle a seguir las instrucciones sobre dnde y cmo usarlos. Las farmacias generalmente imprimen las instrucciones del medicamento slo en las cajas y no directamente en los tubos del [place].   Si su medicamento es muy caro, por favor, pngase en contacto con landry rieger llamando al [phone] y presione la opcin 4 o envenos un mensaje a travs de Clinical Cytogeneticist.   No podemos decirle cul ser su copago por los medicamentos por adelantado ya que esto es diferente dependiendo de la cobertura de su seguro. Sin embargo, es posible que podamos encontrar un medicamento sustituto a audiological scientist un formulario para que el seguro cubra el medicamento que se considera necesario.   Si se requiere una autorizacin previa para que su compaa de seguros cubra su medicamento, por favor permtanos de 1 a 2 das hbiles para completar este proceso.  Los precios de los medicamentos varan con frecuencia dependiendo del environmental consultant de dnde se surte la receta y alguna farmacias pueden ofrecer precios ms baratos.  El sitio web www.goodrx.com tiene cupones para medicamentos de health and safety inspector. Los precios aqu no tienen en cuenta lo que podra costar con la ayuda del seguro (puede ser ms barato con su seguro), pero el sitio web puede darle el precio si no utiliz tourist information centre manager.  - Puede imprimir el cupn correspondiente y llevarlo con su receta a la farmacia.  - Tambin puede pasar por nuestra oficina durante el horario de atencin regular y education officer, museum una tarjeta de cupones de GoodRx.  - Si necesita que su receta se enve electrnicamente a una farmacia diferente, informe a nuestra oficina a travs de MyChart de [place] o por telfono llamando al [phone] y presione la opcin 4.

## 2024-12-01 NOTE — Progress Notes (Signed)
 Follow-Up Visit   Subjective  Taylor Dodson is a 68 y.o. female who presents for the following: Skin Cancer Screening and Full Body Skin Exam  The patient presents for Total-Body Skin Exam (TBSE) for skin cancer screening and mole check. The patient has spots, moles and lesions to be evaluated, some may be new or changing and the patient may have concern these could be cancer. History of skin cancer left upper lip tx 10+ years ago.    The following portions of the chart were reviewed this encounter and updated as appropriate: medications, allergies, medical history  Review of Systems:  No other skin or systemic complaints except as noted in HPI or Assessment and Plan.  Objective  Well appearing patient in no apparent distress; mood and affect are within normal limits.  A full examination was performed including scalp, head, eyes, ears, nose, lips, neck, chest, axillae, abdomen, back, buttocks, bilateral upper extremities, bilateral lower extremities, hands, feet, fingers, toes, fingernails, and toenails. All findings within normal limits unless otherwise noted below.   Relevant physical exam findings are noted in the Assessment and Plan.  L upper lip x 1, L chin x 1, L forehead x 1, chest x 3, R forearm x 1 (7) Pink scaly macules.  R upper arm x 1 Annular erythematous scaly plaque L post upper arm 6 mm pink pearly thin papule   Assessment & Plan   SKIN CANCER SCREENING PERFORMED TODAY.  ACTINIC DAMAGE - Chronic condition, secondary to cumulative UV/sun exposure - diffuse scaly erythematous macules with underlying dyspigmentation - Recommend daily broad spectrum sunscreen SPF 30+ to sun-exposed areas, reapply every 2 hours as needed.  - Staying in the shade or wearing long sleeves, sun glasses (UVA+UVB protection) and wide brim hats (4-inch brim around the entire circumference of the hat) are also recommended for sun protection.  - Call for new or changing  lesions.  LENTIGINES, SEBORRHEIC KERATOSES, HEMANGIOMAS - Benign normal skin lesions - Benign-appearing - Call for any changes  MELANOCYTIC NEVI - Tan-brown and/or pink-flesh-colored symmetric macules and papules - R lower back 5 x 3 mm med dark brown macule - Benign appearing on exam today - Observation - Call clinic for new or changing moles - Recommend daily use of broad spectrum spf 30+ sunscreen to sun-exposed areas.   History of Skin Cancer  Left upper lip- txted with Mohs ~40yrs ago at [facility]. Observe for recurrence.  Call clinic for new or changing lesions.   Recommend regular skin exams, daily broad-spectrum spf 30+ sunscreen use, and photoprotection.      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  - Call if any new or changing lesions are noted between office visits  EXCORIATION Exam: Excoriation at left mid pretibia  Treatment Plan: Recommend vaseline. Call if not resolving.  Xerosis w/pruritus - diffuse xerotic patches; pink excoriated papules on back - recommend gentle, hydrating skin care - gentle skin care handout given - May use otc 1% hydrocortisone cream. Patient defers Rx cream.     AK (ACTINIC KERATOSIS) (7) L upper lip x 1, L chin x 1, L forehead x 1, chest x 3, R forearm x 1 (7) Actinic keratoses are precancerous spots that appear secondary to cumulative UV radiation exposure/sun exposure over time. They are chronic with  expected duration over 1 year. A portion of actinic keratoses will progress to squamous cell carcinoma of the skin. It is not possible to reliably predict which spots will progress to skin cancer and so treatment is recommended to prevent development of skin cancer.  Recommend daily broad spectrum sunscreen SPF 30+ to  sun-exposed areas, reapply every 2 hours as needed.  Recommend staying in the shade or wearing long sleeves, sun glasses (UVA+UVB protection) and wide brim hats (4-inch brim around the entire circumference of the hat). Call for new or changing lesions. Destruction of lesion - L upper lip x 1, L chin x 1, L forehead x 1, chest x 3, R forearm x 1 (7)  Destruction method: cryotherapy   Informed consent: discussed and consent obtained   Lesion destroyed using liquid nitrogen: Yes   Region frozen until ice ball extended beyond lesion: Yes   Outcome: patient tolerated procedure well with no complications   Post-procedure details: wound care instructions given   Additional details:  Prior to procedure, discussed risks of blister formation, small wound, skin dyspigmentation, or rare scar following cryotherapy. Recommend Vaseline ointment to treated areas while healing.   INFLAMED SEBORRHEIC KERATOSIS R upper arm x 1 Symptomatic, irritating. Recheck on follow-up. Destruction of lesion - R upper arm x 1  Destruction method: cryotherapy   Informed consent: discussed and consent obtained   Lesion destroyed using liquid nitrogen: Yes   Region frozen until ice ball extended beyond lesion: Yes   Outcome: patient tolerated procedure well with no complications   Post-procedure details: wound care instructions given   Additional details:  Prior to procedure, discussed risks of blister formation, small wound, skin dyspigmentation, or rare scar following cryotherapy. Recommend Vaseline ointment to treated areas while healing.   NEOPLASM OF UNCERTAIN BEHAVIOR OF SKIN L post upper arm Epidermal / dermal shaving  Lesion diameter (cm):  0.6 Informed consent: discussed and consent obtained   Patient was prepped and draped in usual sterile fashion: Area prepped with alcohol. Anesthesia: the lesion was anesthetized in a standard fashion   Anesthetic:  1% lidocaine  w/ epinephrine 1-100,000 buffered w/ 8.4%  NaHCO3 Instrument used: flexible razor blade   Hemostasis achieved with: pressure, aluminum chloride and electrodesiccation   Outcome: patient tolerated procedure well    Destruction of lesion  Destruction method: electrodesiccation and curettage   Informed consent: discussed and consent obtained   Curettage performed in three different directions: Yes   Electrodesiccation performed over the curetted area: Yes   Final wound size (cm):  0.9 Hemostasis achieved with:  pressure, aluminum chloride and electrodesiccation Outcome: patient tolerated procedure well with no complications   Post-procedure details: wound care instructions given   Post-procedure details comment:  Ointment and bandage applied.  Specimen 1 - Surgical pathology Differential Diagnosis: r/o BCC Check Margins: No EDC today SKIN CANCER SCREENING   ACTINIC SKIN DAMAGE   LENTIGO   SEBORRHEIC KERATOSIS   HEMANGIOMA OF SKIN   NEVUS   HISTORY OF SKIN CANCER   HISTORY OF DYSPLASTIC NEVUS   XEROSIS CUTIS   Return in about 6 months (around 06/01/2025) for recheck R upper arm - ISK, f/u bx/edc.  IAndrea Kerns, CMA, am acting as scribe for Rexene Rattler, MD .   Documentation: I have reviewed the above documentation for accuracy and completeness, and I agree with the above.  Rexene Rattler, MD

## 2024-12-03 LAB — SURGICAL PATHOLOGY

## 2024-12-07 ENCOUNTER — Ambulatory Visit: Payer: Self-pay | Admitting: Dermatology

## 2024-12-07 NOTE — Telephone Encounter (Signed)
 Patient advised of BX results. aw

## 2024-12-07 NOTE — Telephone Encounter (Signed)
-----   Message from Rexene Rattler sent at 12/07/2024  1:32 PM EST ----- 1. Skin, left post upper arm :       BASAL CELL CARCINOMA, SUPERFICIAL AND NODULAR PATTERNS  BCC skin cancer- already treated with EDC at time of biopsy  - please call patient ----- Message ----- From: Interface, Lab In Three Zero One Sent: 12/03/2024   5:32 PM EST To: Rexene Rattler, MD

## 2024-12-07 NOTE — Telephone Encounter (Signed)
 Tried to call patient with biopsy results. Call picked up twice, but unable to speak to patient.

## 2025-01-26 ENCOUNTER — Encounter: Admit: 2025-01-26 | Payer: PRIVATE HEALTH INSURANCE | Primary: Family

## 2025-01-26 ENCOUNTER — Inpatient Hospital Stay: Admit: 2025-01-26 | Discharge: 2025-01-26 | Payer: PRIVATE HEALTH INSURANCE | Primary: Family

## 2025-01-26 VITALS — Ht 67.0 in | Wt 217.0 lb

## 2025-01-26 DIAGNOSIS — Z1231 Encounter for screening mammogram for malignant neoplasm of breast: Principal | ICD-10-CM

## 2025-01-28 LAB — FIT-DNA (COLOGUARD): FIT-DNA (COLOGUARD): NEGATIVE

## 2025-04-19 ENCOUNTER — Encounter: Admit: 2025-04-19 | Payer: PRIVATE HEALTH INSURANCE | Attending: Pulmonary Disease | Primary: Family

## 2025-05-27 ENCOUNTER — Encounter: Admit: 2025-05-27 | Payer: PRIVATE HEALTH INSURANCE | Attending: Family | Primary: Family

## 2025-06-14 ENCOUNTER — Ambulatory Visit: Admitting: Dermatology
